# Patient Record
Sex: Female | Born: 1975 | Race: Black or African American | Hispanic: No | Marital: Single | Smoking: Former smoker
Health system: Southern US, Community
[De-identification: ages and names within clinical notes are randomized; demographics above are authoritative.]

## PROBLEM LIST (undated history)

## (undated) DIAGNOSIS — C539 Malignant neoplasm of cervix uteri, unspecified: Secondary | ICD-10-CM

## (undated) DIAGNOSIS — I10 Essential (primary) hypertension: Secondary | ICD-10-CM

## (undated) DIAGNOSIS — D649 Anemia, unspecified: Secondary | ICD-10-CM

## (undated) DIAGNOSIS — E785 Hyperlipidemia, unspecified: Secondary | ICD-10-CM

## (undated) DIAGNOSIS — B20 Human immunodeficiency virus [HIV] disease: Secondary | ICD-10-CM

## (undated) HISTORY — PX: LEEP: SHX91

## (undated) HISTORY — PX: ABDOMINAL HYSTERECTOMY: SHX81

## (undated) HISTORY — DX: Essential (primary) hypertension: I10

## (undated) HISTORY — DX: Anemia, unspecified: D64.9

## (undated) HISTORY — DX: Malignant neoplasm of cervix uteri, unspecified: C53.9

## (undated) HISTORY — DX: Hyperlipidemia, unspecified: E78.5

## (undated) HISTORY — DX: Human immunodeficiency virus (HIV) disease: B20

---

## 2012-01-20 DIAGNOSIS — F331 Major depressive disorder, recurrent, moderate: Secondary | ICD-10-CM | POA: Diagnosis not present

## 2012-01-23 DIAGNOSIS — L723 Sebaceous cyst: Secondary | ICD-10-CM | POA: Diagnosis not present

## 2012-03-17 DIAGNOSIS — F331 Major depressive disorder, recurrent, moderate: Secondary | ICD-10-CM | POA: Diagnosis not present

## 2012-04-13 DIAGNOSIS — E785 Hyperlipidemia, unspecified: Secondary | ICD-10-CM | POA: Diagnosis not present

## 2012-04-13 DIAGNOSIS — Z113 Encounter for screening for infections with a predominantly sexual mode of transmission: Secondary | ICD-10-CM | POA: Diagnosis not present

## 2012-04-13 DIAGNOSIS — B20 Human immunodeficiency virus [HIV] disease: Secondary | ICD-10-CM | POA: Diagnosis not present

## 2012-04-23 DIAGNOSIS — E785 Hyperlipidemia, unspecified: Secondary | ICD-10-CM | POA: Diagnosis not present

## 2012-04-23 DIAGNOSIS — B20 Human immunodeficiency virus [HIV] disease: Secondary | ICD-10-CM | POA: Diagnosis not present

## 2012-07-13 DIAGNOSIS — D649 Anemia, unspecified: Secondary | ICD-10-CM | POA: Diagnosis not present

## 2012-07-13 DIAGNOSIS — B20 Human immunodeficiency virus [HIV] disease: Secondary | ICD-10-CM | POA: Diagnosis not present

## 2012-10-13 DIAGNOSIS — B20 Human immunodeficiency virus [HIV] disease: Secondary | ICD-10-CM | POA: Diagnosis not present

## 2012-10-13 DIAGNOSIS — R109 Unspecified abdominal pain: Secondary | ICD-10-CM | POA: Diagnosis not present

## 2012-11-30 DIAGNOSIS — Z79899 Other long term (current) drug therapy: Secondary | ICD-10-CM | POA: Diagnosis not present

## 2012-11-30 DIAGNOSIS — R1032 Left lower quadrant pain: Secondary | ICD-10-CM | POA: Diagnosis not present

## 2012-11-30 DIAGNOSIS — R109 Unspecified abdominal pain: Secondary | ICD-10-CM | POA: Diagnosis not present

## 2012-11-30 DIAGNOSIS — B20 Human immunodeficiency virus [HIV] disease: Secondary | ICD-10-CM | POA: Diagnosis not present

## 2012-11-30 DIAGNOSIS — D649 Anemia, unspecified: Secondary | ICD-10-CM | POA: Diagnosis not present

## 2012-11-30 DIAGNOSIS — Z8541 Personal history of malignant neoplasm of cervix uteri: Secondary | ICD-10-CM | POA: Diagnosis not present

## 2012-11-30 DIAGNOSIS — I10 Essential (primary) hypertension: Secondary | ICD-10-CM | POA: Diagnosis not present

## 2012-11-30 DIAGNOSIS — N949 Unspecified condition associated with female genital organs and menstrual cycle: Secondary | ICD-10-CM | POA: Diagnosis not present

## 2012-12-01 DIAGNOSIS — K5900 Constipation, unspecified: Secondary | ICD-10-CM | POA: Diagnosis not present

## 2012-12-01 DIAGNOSIS — R109 Unspecified abdominal pain: Secondary | ICD-10-CM | POA: Diagnosis not present

## 2012-12-06 DIAGNOSIS — K5900 Constipation, unspecified: Secondary | ICD-10-CM | POA: Diagnosis not present

## 2012-12-06 DIAGNOSIS — R109 Unspecified abdominal pain: Secondary | ICD-10-CM | POA: Diagnosis not present

## 2012-12-06 DIAGNOSIS — N83209 Unspecified ovarian cyst, unspecified side: Secondary | ICD-10-CM | POA: Diagnosis not present

## 2012-12-06 DIAGNOSIS — K5730 Diverticulosis of large intestine without perforation or abscess without bleeding: Secondary | ICD-10-CM | POA: Diagnosis not present

## 2013-01-04 DIAGNOSIS — E559 Vitamin D deficiency, unspecified: Secondary | ICD-10-CM | POA: Diagnosis not present

## 2013-01-04 DIAGNOSIS — R5381 Other malaise: Secondary | ICD-10-CM | POA: Diagnosis not present

## 2013-01-04 DIAGNOSIS — K5900 Constipation, unspecified: Secondary | ICD-10-CM | POA: Diagnosis not present

## 2013-01-04 DIAGNOSIS — B20 Human immunodeficiency virus [HIV] disease: Secondary | ICD-10-CM | POA: Diagnosis not present

## 2013-01-04 DIAGNOSIS — E785 Hyperlipidemia, unspecified: Secondary | ICD-10-CM | POA: Diagnosis not present

## 2013-01-27 DIAGNOSIS — B20 Human immunodeficiency virus [HIV] disease: Secondary | ICD-10-CM | POA: Diagnosis not present

## 2013-01-31 DIAGNOSIS — H35419 Lattice degeneration of retina, unspecified eye: Secondary | ICD-10-CM | POA: Diagnosis not present

## 2013-01-31 DIAGNOSIS — H33329 Round hole, unspecified eye: Secondary | ICD-10-CM | POA: Diagnosis not present

## 2013-03-11 DIAGNOSIS — R32 Unspecified urinary incontinence: Secondary | ICD-10-CM | POA: Diagnosis not present

## 2013-03-11 DIAGNOSIS — K5900 Constipation, unspecified: Secondary | ICD-10-CM | POA: Diagnosis not present

## 2013-03-11 DIAGNOSIS — J329 Chronic sinusitis, unspecified: Secondary | ICD-10-CM | POA: Diagnosis not present

## 2013-04-29 DIAGNOSIS — R030 Elevated blood-pressure reading, without diagnosis of hypertension: Secondary | ICD-10-CM | POA: Diagnosis not present

## 2013-04-29 DIAGNOSIS — R51 Headache: Secondary | ICD-10-CM | POA: Diagnosis not present

## 2013-04-29 DIAGNOSIS — B20 Human immunodeficiency virus [HIV] disease: Secondary | ICD-10-CM | POA: Diagnosis not present

## 2013-08-04 DIAGNOSIS — E559 Vitamin D deficiency, unspecified: Secondary | ICD-10-CM | POA: Diagnosis not present

## 2013-08-04 DIAGNOSIS — I10 Essential (primary) hypertension: Secondary | ICD-10-CM | POA: Diagnosis not present

## 2013-08-10 DIAGNOSIS — Z1231 Encounter for screening mammogram for malignant neoplasm of breast: Secondary | ICD-10-CM | POA: Diagnosis not present

## 2013-08-18 DIAGNOSIS — E559 Vitamin D deficiency, unspecified: Secondary | ICD-10-CM | POA: Diagnosis not present

## 2013-08-18 DIAGNOSIS — I10 Essential (primary) hypertension: Secondary | ICD-10-CM | POA: Diagnosis not present

## 2013-09-07 DIAGNOSIS — B20 Human immunodeficiency virus [HIV] disease: Secondary | ICD-10-CM | POA: Diagnosis not present

## 2014-03-02 DIAGNOSIS — B20 Human immunodeficiency virus [HIV] disease: Secondary | ICD-10-CM | POA: Diagnosis not present

## 2014-03-02 DIAGNOSIS — E559 Vitamin D deficiency, unspecified: Secondary | ICD-10-CM | POA: Diagnosis not present

## 2014-04-06 DIAGNOSIS — H33323 Round hole, bilateral: Secondary | ICD-10-CM | POA: Diagnosis not present

## 2014-04-17 DIAGNOSIS — Z79899 Other long term (current) drug therapy: Secondary | ICD-10-CM | POA: Diagnosis not present

## 2014-04-17 DIAGNOSIS — Z13220 Encounter for screening for lipoid disorders: Secondary | ICD-10-CM | POA: Diagnosis not present

## 2014-04-17 DIAGNOSIS — B20 Human immunodeficiency virus [HIV] disease: Secondary | ICD-10-CM | POA: Diagnosis not present

## 2014-04-17 DIAGNOSIS — Z90710 Acquired absence of both cervix and uterus: Secondary | ICD-10-CM | POA: Diagnosis not present

## 2014-04-17 DIAGNOSIS — Z803 Family history of malignant neoplasm of breast: Secondary | ICD-10-CM | POA: Diagnosis not present

## 2014-04-17 DIAGNOSIS — E559 Vitamin D deficiency, unspecified: Secondary | ICD-10-CM | POA: Diagnosis not present

## 2014-04-17 DIAGNOSIS — C539 Malignant neoplasm of cervix uteri, unspecified: Secondary | ICD-10-CM | POA: Diagnosis not present

## 2014-04-17 DIAGNOSIS — Z1231 Encounter for screening mammogram for malignant neoplasm of breast: Secondary | ICD-10-CM | POA: Diagnosis not present

## 2014-04-17 DIAGNOSIS — I10 Essential (primary) hypertension: Secondary | ICD-10-CM | POA: Diagnosis not present

## 2014-04-17 DIAGNOSIS — Z0000 Encounter for general adult medical examination without abnormal findings: Secondary | ICD-10-CM | POA: Diagnosis not present

## 2014-04-27 DIAGNOSIS — Z1231 Encounter for screening mammogram for malignant neoplasm of breast: Secondary | ICD-10-CM | POA: Diagnosis not present

## 2014-05-25 ENCOUNTER — Telehealth: Payer: Self-pay | Admitting: Genetic Counselor

## 2014-05-25 NOTE — Telephone Encounter (Signed)
S/W PATIENT AND GAVE GENETIC APPT FOR 02/04 @ 9 W/GENETIC COUNSELOR  REFERRING DR. Rica Records DX-FHX OF BREAST CA

## 2014-06-01 ENCOUNTER — Ambulatory Visit (HOSPITAL_BASED_OUTPATIENT_CLINIC_OR_DEPARTMENT_OTHER): Payer: Medicare Other | Admitting: Genetic Counselor

## 2014-06-01 ENCOUNTER — Other Ambulatory Visit: Payer: Medicare Other

## 2014-06-01 DIAGNOSIS — Z8541 Personal history of malignant neoplasm of cervix uteri: Secondary | ICD-10-CM | POA: Diagnosis not present

## 2014-06-01 DIAGNOSIS — Z315 Encounter for genetic counseling: Secondary | ICD-10-CM

## 2014-06-01 DIAGNOSIS — Z803 Family history of malignant neoplasm of breast: Secondary | ICD-10-CM

## 2014-06-01 NOTE — Progress Notes (Signed)
[facility] Patient Visit  REFERRING PROVIDER: Nicholos Johns, MD [address] [place], [place] [address]  PRIMARY PROVIDER:  No primary care provider on file.  PRIMARY REASON FOR VISIT:  1. Family history of breast cancer     HISTORY OF PRESENT ILLNESS:   Jacqueline Lowe, a 39 y.o. female, was seen for a [facility] cancer genetics consultation at the request of Dr. Rica Records due to a family history of breast cancer.  Jacqueline Lowe presents to clinic today to discuss the possibility of a hereditary predisposition to cancer, genetic testing, and to further clarify her future cancer risks, as well as potential cancer risks for family members.   CANCER HISTORY:  Jacqueline Lowe was diagnosed with cervical cancer at age 84. She has no history of other cancer  HORMONAL RISK FACTORS AND CANCER SCREENING:  Menarche was at age 44.  First live birth at age 64.  Ovaries intact: yes.  Hysterectomy: yes.  Menopausal status: premenopausal.  Colonoscopy: n/a; not examined. Mammogram within the last year: yes. Number of breast biopsies: 0. Up to date with pelvic exams:  yes. Any excessive radiation exposure in the past:  n/a  Past Medical History  Diagnosis Date   Cervical cancer     dx at age 60   HIV (human immunodeficiency virus infection)     reported to be HIV+ by patient    Past Surgical History  Procedure Laterality Date   Abdominal hysterectomy      excessive bleeding; ovaries remain intact    History   Social History   Marital Status: Single    Spouse Name: N/A    Number of Children: N/A   Years of Education: N/A   Social History Main Topics   Smoking status: Not on file   Smokeless tobacco: Not on file   Alcohol Use: Not on file   Drug Use: Not on file   Sexual Activity: Not on file   Other Topics Concern   Not on file   Social History Narrative   No narrative on file     FAMILY HISTORY:  During the  visit, a 4-generation pedigree was obtained. A copy of the pedigree with be scanned into Epic under the Media tab. Significant family history diagnoses include the following: Family History  Problem Relation Age of Onset   Other Father     does not know father or pat history   Cancer Maternal Aunt 23    breast cancer   Cancer Maternal Aunt 40    breast cancer   Jacqueline Lowe's ancestry is of [place], [place] and Caucasian descent. There is no known Jewish ancestry or consanguinity.  GENETIC COUNSELING ASSESSMENT:  Jacqueline Lowe is a 39 y.o. female with a maternal family history of early onset breast cancer somewhat suggestive of a hereditary predisposition to cancer. We, therefore, discussed and recommended the following at today's visit.   DISCUSSION:  We reviewed the characteristics, features and inheritance patterns of hereditary cancer syndromes. We also discussed genetic testing, including the appropriate family members to test, the process of testing, insurance coverage and turn-around-time for results. We discussed the implications of a negative, positive and/or variant of uncertain significant result. We recommended Jacqueline Lowe pursue genetic testing for the BreastNext gene panel. The BreastNext gene panel offered by [facility] includes sequencing and rearrangement analysis for the following 17 genes: ATM, BARD1, BRCA1, BRCA2, BRIP1, CDH1, CHEK2, MRE11A, MUTYH, NBN, NF1, PALB2, PTEN, RAD50, RAD51C, RAD51D,  and TP53.  PLAN:  Based on our above recommendation, Jacqueline Lowe wished to pursue genetic testing and the blood sample was drawn and will be sent to [facility] for analysis. Results should be available within approximately 6 weeks time, at which point they will be disclosed by telephone to Jacqueline Lowe, as will any additional recommendations warranted by these results. Based on Jacqueline Lowe family history, we also recommended her maternal aunts diagnosed with  breast cancer have genetic counseling and testing. Jacqueline Lowe will let us know if we can be of any assistance in coordinating genetic counseling and/or testing for this family member. Lastly, we encouraged Jacqueline Lowe to remain in contact with cancer genetics annually so that we can continuously update the family history and inform her of any changes in cancer genetics and testing that may be of benefit for this family.   Ms.  Lowe questions were answered to her satisfaction today. Our contact information was provided should additional questions or concerns arise. Thank you for the referral and allowing [place] to share in the care of your patient.   Jacqueline A. Fine, MS, CGC Certified Psychologist, sport and [email or web address] phone: [phone]  The patient was seen for a total of 40 minutes in face-to-face genetic counseling.  This patient was discussed with Dr. Jana Hakim who agrees with the above.    ______________________________________________________________________ For Office Staff:  Number of people involved in session including genetic counselor: 2 Was an intern or student involved with case: not applicable

## 2014-06-26 ENCOUNTER — Encounter: Payer: Self-pay | Admitting: Genetic Counselor

## 2014-06-26 DIAGNOSIS — Z803 Family history of malignant neoplasm of breast: Secondary | ICD-10-CM

## 2014-06-26 NOTE — Progress Notes (Signed)
[facility]  Genetic Test Results PRIMARY PROVIDER:  No primary care provider on file.  PRIMARY REASON FOR VISIT:  Family history of breast cancer  GENETIC TEST RESULT:  Testing Laboratory: [facility]  Test Ordered: BreastNext gene panel Date of Report: 06/21/2014 Result: Normal, no pathogenic mutations identified General Interpretation: Reassuring  HPI: Jacqueline Lowe was previously seen in [facility] due to concerns regarding a hereditary predisposition to cancer. Please refer to our prior cancer genetics clinic note for more information regarding Jacqueline Lowe's medical, social and family histories, and our assessment and recommendations, at the time. Jacqueline Lowe genetic test results and recommendations warranted by these results were recently disclosed to her and are discussed in more detail below.  GENETIC TEST RESULTS: At the time of Jacqueline Lowe's visit, we recommended she pursue genetic testing, which includes sequencing and deletion/duplication analysis of several genes associated with an increased risk for cancer via a gene panel. The BreastNext gene panel offered by [facility] and includes sequencing and rearrangement analysis for the following 17 genes: ATM, BARD1, BRCA1, BRCA2, BRIP1, CDH1, CHEK2, MRE11A, MUTYH, NBN, NF1, PALB2, PTEN, RAD50, RAD51C, RAD51D, and TP53. Genetic testing for this gene panel was normal and did not reveal a pathogenic mutation in any of these genes. A copy of the genetic test report will be scanned into Epic under the media tab.   We discussed with Jacqueline Lowe that current genetic testing is not perfect, and it is, therefore, possible there may be a pathogenic gene mutation in one of these genes that current testing cannot detect, but that chance is small.  We also discussed, that it is possible that another gene that has not yet been discovered, or that we have not yet tested, is  responsible for the cancer diagnoses in her family. It is, therefore, important for Jacqueline Lowe to continue to remain in touch with cancer genetics so that we can continue to offer Jacqueline Lowe the most up to date genetic testing.   CANCER SCREENING RECOMMENDATIONS:  This normal result is reassuring and indicates that Jacqueline Lowe does not likely have an increased risk of cancer due to a mutation in one of these genes.  We, therefore, recommended  Jacqueline Lowe continue to follow the cancer screening guidelines provided by her primary healthcare providers.   RECOMMENDATIONS FOR FAMILY MEMBERS:  While these results are reassuring for Jacqueline Lowe, this test does not tell us anything about Jacqueline Lowe's sister's and maternal relatives' risks. We recommended these relatives also have genetic counseling and testing and are happy to help facilitate testing and/or a referral if needed. Cancer genetic counselors can also be located, by visiting the website of the [employer] (Artist[employer].se) and Field seismologist for a Oncologist by zip code.    FOLLOW-UP: Lastly, we discussed with Jacqueline Lowe that cancer genetics is a rapidly advancing field and it is likely that new genetic tests will be appropriate for her and/or family members in the future. We encouraged her to remain in contact with cancer genetics on an annual basis so we can update her personal and family histories and let her know of advances in cancer genetics that may benefit this family.   Our contact number was provided. Jacqueline Lowe questions were answered to her satisfaction, and she knows she is welcome to call us at anytime with additional questions or concerns.    Jacqueline A. Fine, MS, CGC Certified  Psychologist, [email or web address] Phone: [phone]

## 2014-07-13 DIAGNOSIS — H33302 Unspecified retinal break, left eye: Secondary | ICD-10-CM | POA: Diagnosis not present

## 2014-07-13 DIAGNOSIS — H35413 Lattice degeneration of retina, bilateral: Secondary | ICD-10-CM | POA: Diagnosis not present

## 2014-07-26 DIAGNOSIS — H33302 Unspecified retinal break, left eye: Secondary | ICD-10-CM | POA: Diagnosis not present

## 2014-08-02 DIAGNOSIS — H33302 Unspecified retinal break, left eye: Secondary | ICD-10-CM | POA: Diagnosis not present

## 2014-08-17 DIAGNOSIS — I10 Essential (primary) hypertension: Secondary | ICD-10-CM | POA: Diagnosis not present

## 2014-08-17 DIAGNOSIS — B20 Human immunodeficiency virus [HIV] disease: Secondary | ICD-10-CM | POA: Diagnosis not present

## 2014-09-04 ENCOUNTER — Telehealth: Payer: Self-pay

## 2014-09-04 NOTE — Telephone Encounter (Signed)
Patient calling to transfer care from [place] [facility] [place].  Medical records received.   She lives in [place] and has requested same day labs and visit.  She was recently changed to [place] and wanted to follow up in one month.  Directions given.   Laverle Patter, RN

## 2014-09-27 DIAGNOSIS — H33303 Unspecified retinal break, bilateral: Secondary | ICD-10-CM | POA: Diagnosis not present

## 2014-10-02 ENCOUNTER — Encounter: Payer: Self-pay | Admitting: *Deleted

## 2014-10-02 ENCOUNTER — Other Ambulatory Visit (HOSPITAL_COMMUNITY)
Admission: RE | Admit: 2014-10-02 | Discharge: 2014-10-02 | Disposition: A | Discharge status: Home / Self Care | Payer: Medicare Other | Source: Ambulatory Visit | Attending: Internal Medicine | Admitting: Internal Medicine

## 2014-10-02 ENCOUNTER — Encounter: Payer: Self-pay | Admitting: Internal Medicine

## 2014-10-02 ENCOUNTER — Ambulatory Visit (INDEPENDENT_AMBULATORY_CARE_PROVIDER_SITE_OTHER): Payer: Medicare Other | Admitting: Internal Medicine

## 2014-10-02 VITALS — BP 172/124 | HR 78 | Temp 98.5°F | Ht 64.0 in | Wt 169.0 lb

## 2014-10-02 DIAGNOSIS — Z113 Encounter for screening for infections with a predominantly sexual mode of transmission: Secondary | ICD-10-CM | POA: Insufficient documentation

## 2014-10-02 DIAGNOSIS — Z79899 Other long term (current) drug therapy: Secondary | ICD-10-CM | POA: Diagnosis not present

## 2014-10-02 DIAGNOSIS — B20 Human immunodeficiency virus [HIV] disease: Secondary | ICD-10-CM | POA: Insufficient documentation

## 2014-10-02 NOTE — Progress Notes (Signed)
   Subjective:    Patient ID: Jacqueline Lowe, female    DOB: 28-Dec-1975, 39 y.o.   MRN: [id number]  HPI Jacqueline Lowe is a 39 y/o female transferring care for HIV from [place]. Dx with HIV in 2000 and taking Stribild X1 month, tolerating well. No missed doses. Last CD4/VL checked before Stribild was started. She states she has been undetectable for years with last CD4 count 748. She has a PMH of invasive cervical CA with partial hysterectomy in 2013. Fam Hx of breast CA. Genetic testing done for breast CA and was normal. Sexual history: monogomous with partner X 9 years, uses condoms. No Hx STD's. Her partner is HIV neg. She does not smoke. No recreational drug use. Has refused vaccines in the past. Occasionally drinks alcohol. Her blood pressure today is elevated. She denies headache or blurry vision. She states she used to take HCTZ but this was stopped a few years ago because her blood pressure was normal. She states she no longer has a PCP.   Outpatient Encounter Prescriptions as of 10/02/2014  Medication Sig Note  . Cholecalciferol (VITAMIN D PO) Take 5,000 Units by mouth daily.   [name] 150-150-200-300 MG TABS tablet  10/02/2014: Received from: External Pharmacy   No facility-administered encounter medications on file as of 10/02/2014.     Review of Systems  Constitutional: Negative for fever, chills, diaphoresis, fatigue and unexpected weight change.  HENT: Negative for mouth sores, sore throat and tinnitus.   Eyes: Negative for visual disturbance.  Respiratory: Negative for shortness of breath.   Cardiovascular: Negative for leg swelling.  Gastrointestinal: Negative for nausea, abdominal pain, diarrhea and constipation.  Genitourinary: Negative for difficulty urinating and menstrual problem.  Musculoskeletal: Negative for myalgias, joint swelling and arthralgias.  Skin: Negative for rash.  Neurological: Negative for light-headedness and headaches.  Hematological: Negative for  adenopathy.  Psychiatric/Behavioral: Negative for behavioral problems.       Objective:   Physical Exam  Constitutional: She is oriented to person, place, and time. She appears well-developed and well-nourished.  HENT:  Head: Normocephalic and atraumatic.  Mouth/Throat: No oropharyngeal exudate.  Eyes: Conjunctivae are normal. Pupils are equal, round, and reactive to light.  Neck: Normal range of motion. Neck supple.  Cardiovascular: Normal rate and regular rhythm.   Pulmonary/Chest: Effort normal and breath sounds normal.  Abdominal: Soft. Bowel sounds are normal. She exhibits no distension.  Musculoskeletal: Normal range of motion.  Lymphadenopathy:    She has no cervical adenopathy.  Neurological: She is alert and oriented to person, place, and time.  Skin: Skin is warm and dry.  Multiple tattoos and piercings  Psychiatric: She has a normal mood and affect. Her behavior is normal.          Assessment & Plan:  HIV, well controlled: Continue current therapy of Stribild Will check labs today RTC in 4 months Refuses all vaccines  Hypertension: Given information about obtaining PCP to follow up on BP

## 2014-10-02 NOTE — Progress Notes (Signed)
Patient ID: Jacqueline Lowe, female   DOB: [birth year], 39 y.o.   MRN: [id number]  Patient ID: Jacqueline Lowe, female    DOB: [birth year], 39 y.o.   MRN: [id number]  HPI:   She comes in to establish as a new patient.  She has a history of HIV for 16 years and has previously been on Viramune and combivir, then Reyataz/r and Truvada and recently started on Stribild, which she has been on 1 month.  Some nausea but tolerated much better than previous regimen.  No history of OIs, other STIs.  History of hysterectomy.  Previously at a clinic in [place] but now living in [place].  Was also previously on blood pressure medication.  Does not smoke.  No history of drug use.   Past Medical History  Diagnosis Date  . Cervical cancer     dx at age 66  . HIV (human immunodeficiency virus infection)     reported to be HIV+ by patient    Prior to Admission medications   Medication Sig Start Date End Date Taking? Authorizing Provider  Cholecalciferol (VITAMIN D PO) Take 5,000 Units by mouth daily.   Yes Historical Provider, MD  STRIBILD 150-150-200-300 MG TABS tablet  09/19/14  Yes Historical Provider, MD    No Known Allergies  History  Substance Use Topics  . Smoking status: Former Research scientist (life sciences)  . Smokeless tobacco: Never Used  . Alcohol Use: 0.0 oz/week    0 Standard drinks or equivalent per week     Comment: socially    Family History  Problem Relation Age of Onset  . Other Father     does not know father or pat history  . Cancer Maternal Aunt 40    breast cancer  . Cancer Maternal Aunt 40    breast cancer    Review of Systems A comprehensive review of systems was negative.   Filed Vitals:   10/02/14 1355  BP: 172/124  Pulse: 78  Temp: 98.5 F (36.9 C)   in no apparent distress and alert HEENT: anicteric Cor RRR and No murmurs clear Bowel sounds are normal, liver is not enlarged, spleen is not enlarged peripheral pulses normal, no pedal edema, no clubbing or cyanosis negative for  - jaundice, spider hemangioma, telangiectasia, palmar erythema, ecchymosis and atrophy  No results found for: HIV1RNAQUANT No components found for: HIV1GENOTYPRPLUS No components found for: THELPERCELL  Assessment: new HIV patient  Plan: 1) check viral load and CD4 count and all labs.   2) patient refuses all vaccines. 3) BP high and will get a PCP.

## 2014-10-03 ENCOUNTER — Telehealth: Payer: Self-pay | Admitting: *Deleted

## 2014-10-03 LAB — RPR

## 2014-10-03 LAB — CBC WITH DIFFERENTIAL/PLATELET
BASOS PCT: 0 % (ref 0–1)
Basophils Absolute: 0 10*3/uL (ref 0.0–0.1)
EOS PCT: 2 % (ref 0–5)
Eosinophils Absolute: 0.1 10*3/uL (ref 0.0–0.7)
HCT: 37.9 % (ref 36.0–46.0)
Hemoglobin: 12.6 g/dL (ref 12.0–15.0)
LYMPHS PCT: 37 % (ref 12–46)
Lymphs Abs: 1.7 10*3/uL (ref 0.7–4.0)
MCH: 28.6 pg (ref 26.0–34.0)
MCHC: 33.2 g/dL (ref 30.0–36.0)
MCV: 85.9 fL (ref 78.0–100.0)
MPV: 10 fL (ref 8.6–12.4)
Monocytes Absolute: 0.2 10*3/uL (ref 0.1–1.0)
Monocytes Relative: 5 % (ref 3–12)
NEUTROS PCT: 56 % (ref 43–77)
Neutro Abs: 2.5 10*3/uL (ref 1.7–7.7)
PLATELETS: 322 10*3/uL (ref 150–400)
RBC: 4.41 MIL/uL (ref 3.87–5.11)
RDW: 14.1 % (ref 11.5–15.5)
WBC: 4.5 10*3/uL (ref 4.0–10.5)

## 2014-10-03 LAB — LIPID PANEL
CHOL/HDL RATIO: 8.1 ratio
Cholesterol: 218 mg/dL — ABNORMAL HIGH (ref 0–200)
HDL: 27 mg/dL — ABNORMAL LOW (ref >=46)
LDL Cholesterol: 146 mg/dL — ABNORMAL HIGH (ref 0–99)
Triglycerides: 226 mg/dL — ABNORMAL HIGH (ref <150)
VLDL: 45 mg/dL — ABNORMAL HIGH (ref 0–40)

## 2014-10-03 LAB — COMPLETE METABOLIC PANEL WITH GFR
ALBUMIN: 4 g/dL (ref 3.5–5.2)
ALT: 9 U/L (ref 0–35)
AST: 13 U/L (ref 0–37)
Alkaline Phosphatase: 60 U/L (ref 39–117)
BILIRUBIN TOTAL: 0.3 mg/dL (ref 0.2–1.2)
BUN: 6 mg/dL (ref 6–23)
CO2: 24 mEq/L (ref 19–32)
Calcium: 9.8 mg/dL (ref 8.4–10.5)
Chloride: 105 mEq/L (ref 96–112)
Creat: 0.71 mg/dL (ref 0.50–1.10)
GFR, Est African American: >89 mL/min
GLUCOSE: 127 mg/dL — AB (ref 70–99)
POTASSIUM: 3.6 meq/L (ref 3.5–5.3)
Sodium: 137 mEq/L (ref 135–145)
TOTAL PROTEIN: 8.1 g/dL (ref 6.0–8.3)

## 2014-10-03 LAB — HIV 1/2 CONFIRMATION
HIV 2 AB: NEGATIVE
HIV-1 antibody: POSITIVE — AB

## 2014-10-03 LAB — HIV ANTIBODY (ROUTINE TESTING W REFLEX): HIV: REACTIVE — AB

## 2014-10-03 LAB — HEPATITIS B SURFACE ANTIBODY,QUALITATIVE: Hep B S Ab: NEGATIVE

## 2014-10-03 LAB — HEPATITIS B SURFACE ANTIGEN: Hepatitis B Surface Ag: NEGATIVE

## 2014-10-03 LAB — HEPATITIS A ANTIBODY, TOTAL: Hep A Total Ab: NONREACTIVE

## 2014-10-03 LAB — HEPATITIS B CORE ANTIBODY, TOTAL: Hep B Core Total Ab: NONREACTIVE

## 2014-10-03 NOTE — Telephone Encounter (Signed)
Patient has been referred to Dr Buelah Manis at [facility] for primary care. She was given the phone number to set up an appointment.  Per the scheduler, they are accepting patients with medicare/medicaid, will be able to see her within a few weeks once she contacts them. Landis Gandy, RN

## 2014-10-04 LAB — T-HELPER CELL (CD4) - (RCID CLINIC ONLY)
CD4 % Helper T Cell: 39 % (ref 33–55)
CD4 T CELL ABS: 640 /uL (ref 400–2700)

## 2014-10-04 LAB — URINE CYTOLOGY ANCILLARY ONLY
Chlamydia: NEGATIVE
Neisseria Gonorrhea: NEGATIVE

## 2014-10-04 LAB — HIV-1 RNA QUANT-NO REFLEX-BLD

## 2014-10-07 LAB — HLA B*5701: HLA-B 5701 W/RFLX HLA-B HIGH: NEGATIVE

## 2015-02-01 ENCOUNTER — Encounter: Payer: Self-pay | Admitting: Internal Medicine

## 2015-02-01 ENCOUNTER — Ambulatory Visit (INDEPENDENT_AMBULATORY_CARE_PROVIDER_SITE_OTHER): Payer: Medicare Other | Admitting: Internal Medicine

## 2015-02-01 VITALS — BP 156/111 | HR 105 | Temp 98.5°F | Wt 176.0 lb

## 2015-02-01 DIAGNOSIS — B20 Human immunodeficiency virus [HIV] disease: Secondary | ICD-10-CM | POA: Diagnosis not present

## 2015-02-01 NOTE — Assessment & Plan Note (Signed)
Doing well.  Labs today and rtc in 4-5 months unless concerns.  She was again given the phone number for PCP to establish care.

## 2015-02-01 NOTE — Progress Notes (Signed)
   Subjective:    Patient ID: Jacqueline Lowe, female    DOB: [birth year], 39 y.o.   MRN: [id number]  HPI Here for follow up of HIV.  On stribild and denies any missed doses.  Some fatigue, headache.  Did not establish with a PCP though was given the information for [facility].  No new complaints.     Review of Systems  Constitutional: Negative for fatigue.  Gastrointestinal: Negative for nausea and diarrhea.  Skin: Negative for rash.  Neurological: Negative for dizziness and light-headedness.       Objective:   Physical Exam  Constitutional: She appears well-developed and well-nourished.  Eyes: No scleral icterus.  Cardiovascular: Normal rate, regular rhythm and normal heart sounds.   No murmur heard. Pulmonary/Chest: Effort normal and breath sounds normal. No respiratory distress.  Skin: No rash noted.          Assessment & Plan:

## 2015-02-02 LAB — T-HELPER CELL (CD4) - (RCID CLINIC ONLY)
CD4 % Helper T Cell: 36 % (ref 33–55)
CD4 T CELL ABS: 630 /uL (ref 400–2700)

## 2015-02-04 LAB — HIV-1 RNA QUANT-NO REFLEX-BLD

## 2015-02-28 ENCOUNTER — Other Ambulatory Visit: Payer: Self-pay | Admitting: *Deleted

## 2015-02-28 ENCOUNTER — Telehealth: Payer: Self-pay | Admitting: *Deleted

## 2015-02-28 MED ORDER — STRIBILD 150-150-200-300 MG PO TABS: 1.0000 | ORAL_TABLET | Freq: Every day | ORAL | Status: DC

## 2015-02-28 NOTE — Telephone Encounter (Signed)
Patient called requesting a refill on Stribild and Vitamin D. Refilled the Stribild, however Dr. Linus Salmons never prescribed the vitamin D. Asked if she ever scheduled an appt with Jonni Sanger for PCP and she said she could never get anyone to answer the phone. Scheduled her an appt at [facility] for 03/15/15 and provided her the address and phone number.

## 2015-03-15 ENCOUNTER — Ambulatory Visit (INDEPENDENT_AMBULATORY_CARE_PROVIDER_SITE_OTHER): Payer: Medicare Other | Admitting: Family Medicine

## 2015-03-15 ENCOUNTER — Encounter: Payer: Self-pay | Admitting: Family Medicine

## 2015-03-15 VITALS — BP 160/96 | HR 100 | Temp 98.5°F | Resp 16 | Ht 64.0 in | Wt 175.0 lb

## 2015-03-15 DIAGNOSIS — R739 Hyperglycemia, unspecified: Secondary | ICD-10-CM

## 2015-03-15 DIAGNOSIS — E785 Hyperlipidemia, unspecified: Secondary | ICD-10-CM | POA: Insufficient documentation

## 2015-03-15 DIAGNOSIS — Z862 Personal history of diseases of the blood and blood-forming organs and certain disorders involving the immune mechanism: Secondary | ICD-10-CM

## 2015-03-15 DIAGNOSIS — I10 Essential (primary) hypertension: Secondary | ICD-10-CM

## 2015-03-15 DIAGNOSIS — R7309 Other abnormal glucose: Secondary | ICD-10-CM | POA: Diagnosis not present

## 2015-03-15 DIAGNOSIS — Z0000 Encounter for general adult medical examination without abnormal findings: Secondary | ICD-10-CM

## 2015-03-15 DIAGNOSIS — B20 Human immunodeficiency virus [HIV] disease: Secondary | ICD-10-CM

## 2015-03-15 LAB — CBC WITH DIFFERENTIAL/PLATELET
BASOS ABS: 0 10*3/uL (ref 0.0–0.1)
Basophils Relative: 1 % (ref 0–1)
Eosinophils Absolute: 0.1 10*3/uL (ref 0.0–0.7)
Eosinophils Relative: 2 % (ref 0–5)
HEMATOCRIT: 41.2 % (ref 36.0–46.0)
HEMOGLOBIN: 13.6 g/dL (ref 12.0–15.0)
LYMPHS ABS: 1.9 10*3/uL (ref 0.7–4.0)
LYMPHS PCT: 39 % (ref 12–46)
MCH: 29.3 pg (ref 26.0–34.0)
MCHC: 33 g/dL (ref 30.0–36.0)
MCV: 88.8 fL (ref 78.0–100.0)
MONO ABS: 0.3 10*3/uL (ref 0.1–1.0)
MPV: 10.3 fL (ref 8.6–12.4)
Monocytes Relative: 7 % (ref 3–12)
NEUTROS ABS: 2.4 10*3/uL (ref 1.7–7.7)
Neutrophils Relative %: 51 % (ref 43–77)
Platelets: 364 10*3/uL (ref 150–400)
RBC: 4.64 MIL/uL (ref 3.87–5.11)
RDW: 13.2 % (ref 11.5–15.5)
WBC: 4.8 10*3/uL (ref 4.0–10.5)

## 2015-03-15 LAB — COMPLETE METABOLIC PANEL WITH GFR
ALT: 11 U/L (ref 6–29)
AST: 15 U/L (ref 10–30)
Albumin: 4.3 g/dL (ref 3.6–5.1)
Alkaline Phosphatase: 69 U/L (ref 33–115)
BUN: 10 mg/dL (ref 7–25)
CALCIUM: 9.8 mg/dL (ref 8.6–10.2)
CHLORIDE: 104 mmol/L (ref 98–110)
CO2: 24 mmol/L (ref 20–31)
CREATININE: 0.66 mg/dL (ref 0.50–1.10)
GFR, Est African American: >89 mL/min (ref >=60)
GFR, Est Non African American: >89 mL/min (ref >=60)
Glucose, Bld: 113 mg/dL — ABNORMAL HIGH (ref 65–99)
POTASSIUM: 3.5 mmol/L (ref 3.5–5.3)
SODIUM: 139 mmol/L (ref 135–146)
Total Bilirubin: 0.3 mg/dL (ref 0.2–1.2)
Total Protein: 8.1 g/dL (ref 6.1–8.1)

## 2015-03-15 LAB — POCT URINALYSIS DIP (DEVICE)
Bilirubin Urine: NEGATIVE
GLUCOSE, UA: NEGATIVE mg/dL
KETONES UR: NEGATIVE mg/dL
LEUKOCYTES UA: NEGATIVE
Nitrite: NEGATIVE
Protein, ur: 100 mg/dL — AB
SPECIFIC GRAVITY, URINE: 1.025 (ref 1.005–1.030)
UROBILINOGEN UA: 0.2 mg/dL (ref 0.0–1.0)
pH: 6.5 (ref 5.0–8.0)

## 2015-03-15 LAB — GLUCOSE, CAPILLARY: GLUCOSE-CAPILLARY: 114 mg/dL — AB (ref 65–99)

## 2015-03-15 LAB — LIPID PANEL
CHOL/HDL RATIO: 7.9 ratio — AB (ref <=5.0)
CHOLESTEROL: 229 mg/dL — AB (ref 125–200)
HDL: 29 mg/dL — ABNORMAL LOW (ref >=46)
LDL CALC: 157 mg/dL — AB (ref <130)
Triglycerides: 216 mg/dL — ABNORMAL HIGH (ref <150)
VLDL: 43 mg/dL — AB (ref <30)

## 2015-03-15 MED ORDER — AMLODIPINE BESYLATE 5 MG PO TABS: 5.0000 mg | ORAL_TABLET | Freq: Every day | ORAL | Status: DC

## 2015-03-15 MED ORDER — CLONIDINE HCL 0.1 MG PO TABS
0.1000 mg | ORAL_TABLET | Freq: Once | ORAL | Status: AC
  Administered 2015-03-15: 0.1 mg via ORAL

## 2015-03-15 MED ORDER — FENOFIBRATE 40 MG PO TABS: 40.0000 mg | ORAL_TABLET | Freq: Every day | ORAL | Status: DC

## 2015-03-15 MED ORDER — ASPIRIN EC 81 MG PO TBEC
81.0000 mg | DELAYED_RELEASE_TABLET | Freq: Every day | ORAL | Status: DC
Start: 2015-03-15 — End: 2015-10-25

## 2015-03-15 NOTE — Patient Instructions (Addendum)
Will start Amlodipine 5 mg today for hypertension. Will follow up for hypertension in 2 weeks.  Will start fenofibrate 40 mg for hyperlipidemia today. Will call patient with laboratory results  Recommend a lowfat, low carbohydrate diet divided over 5-6 small meals, increase water intake to 6-8 glasses, and 150 minutes per week of cardiovascular exercise.     Fat and Cholesterol Restricted Diet Getting too much fat and cholesterol in your diet may cause health problems. Following this diet helps keep your fat and cholesterol at normal levels. This can keep you from getting sick. WHAT TYPES OF FAT SHOULD I CHOOSE?  Choose monosaturated and polyunsaturated fats. These are found in foods such as olive oil, canola oil, flaxseeds, walnuts, almonds, and seeds.  Eat more omega-3 fats. Good choices include salmon, mackerel, sardines, tuna, flaxseed oil, and ground flaxseeds.  Limit saturated fats. These are in animal products such as meats, butter, and cream. They can also be in plant products such as palm oil, palm kernel oil, and coconut oil.   Avoid foods with partially hydrogenated oils in them. These contain trans fats. Examples of foods that have trans fats are stick margarine, some tub margarines, cookies, crackers, and other baked goods. WHAT GENERAL GUIDELINES DO I NEED TO FOLLOW?   Check food labels. Look for the words "trans fat" and "saturated fat."  When preparing a meal:  Fill half of your plate with vegetables and green salads.  Fill one fourth of your plate with whole grains. Look for the word "whole" as the first word in the ingredient list.  Fill one fourth of your plate with lean protein foods.  Limit fruit to two servings a day. Choose fruit instead of juice.  Eat more foods with soluble fiber. Examples of foods with this type of fiber are apples, broccoli, carrots, beans, peas, and barley. Try to get 20-30 g (grams) of fiber per day.  Eat more home-cooked foods. Eat  less at restaurants and buffets.  Limit or avoid alcohol.  Limit foods high in starch and sugar.  Limit fried foods.  Cook foods without frying them. Baking, boiling, grilling, and broiling are all great options.  Lose weight if you are overweight. Losing even a small amount of weight can help your overall health. It can also help prevent diseases such as diabetes and heart disease. WHAT FOODS CAN I EAT? Grains Whole grains, such as whole wheat or whole grain breads, crackers, cereals, and pasta. Unsweetened oatmeal, bulgur, barley, quinoa, or brown rice. Corn or whole wheat flour tortillas. Vegetables Fresh or frozen vegetables (raw, steamed, roasted, or grilled). Green salads. Fruits All fresh, canned (in natural juice), or frozen fruits. Meat and Other Protein Products Ground beef (85% or leaner), grass-fed beef, or beef trimmed of fat. Skinless chicken or [place]. Ground chicken or [place]. Pork trimmed of fat. All fish and seafood. Eggs. Dried beans, peas, or lentils. Unsalted nuts or seeds. Unsalted canned or dry beans. Dairy Low-fat dairy products, such as skim or 1% milk, 2% or reduced-fat cheeses, low-fat ricotta or cottage cheese, or plain low-fat yogurt. Fats and Oils Tub margarines without trans fats. Light or reduced-fat mayonnaise and salad dressings. Avocado. Olive, canola, sesame, or safflower oils. Natural peanut or almond butter (choose ones without added sugar and oil). The items listed above may not be a complete list of recommended foods or beverages. Contact your dietitian for more options. WHAT FOODS ARE NOT RECOMMENDED? Grains White bread. White pasta. White rice. Cornbread. Bagels, pastries, and  croissants. Crackers that contain trans fat. Vegetables White potatoes. Corn. Creamed or fried vegetables. Vegetables in a cheese sauce. Fruits Dried fruits. Canned fruit in light or heavy syrup. Fruit juice. Meat and Other Protein Products Fatty cuts of meat. Ribs,  chicken wings, bacon, sausage, bologna, salami, chitterlings, fatback, hot dogs, bratwurst, and packaged luncheon meats. Liver and organ meats. Dairy Whole or 2% milk, cream, half-and-half, and cream cheese. Whole milk cheeses. Whole-fat or sweetened yogurt. Full-fat cheeses. Nondairy creamers and whipped toppings. Processed cheese, cheese spreads, or cheese curds. Sweets and Desserts Corn syrup, sugars, honey, and molasses. Candy. Jam and jelly. Syrup. Sweetened cereals. Cookies, pies, cakes, donuts, muffins, and ice cream. Fats and Oils Butter, stick margarine, lard, shortening, ghee, or bacon fat. Coconut, palm kernel, or palm oils. Beverages Alcohol. Sweetened drinks (such as sodas, lemonade, and fruit drinks or punches). The items listed above may not be a complete list of foods and beverages to avoid. Contact your dietitian for more information.   This information is not intended to replace advice given to you by your health care provider. Make sure you discuss any questions you have with your health care provider.   Document Released: 10/14/2011 Document Revised: 05/05/2014 Document Reviewed: 07/14/2013 Elsevier Interactive Patient Education 2016 [place] DASH stands for "Dietary Approaches to Stop Hypertension." The DASH eating plan is a healthy eating plan that has been shown to reduce high blood pressure (hypertension). Additional health benefits may include reducing the risk of type 2 diabetes mellitus, heart disease, and stroke. The DASH eating plan may also help with weight loss. WHAT DO I NEED TO KNOW ABOUT THE DASH EATING PLAN? For the DASH eating plan, you will follow these general guidelines:  Choose foods with a percent daily value for sodium of less than 5% (as listed on the food label).  Use salt-free seasonings or herbs instead of table salt or sea salt.  Check with your health care provider or pharmacist before using salt substitutes.  Eat  lower-sodium products, often labeled as "lower sodium" or "no salt added."  Eat fresh foods.  Eat more vegetables, fruits, and low-fat dairy products.  Choose whole grains. Look for the word "whole" as the first word in the ingredient list.  Choose fish and skinless chicken or [place] more often than red meat. Limit fish, poultry, and meat to 6 oz (170 g) each day.  Limit sweets, desserts, sugars, and sugary drinks.  Choose heart-healthy fats.  Limit cheese to 1 oz (28 g) per day.  Eat more home-cooked food and less restaurant, buffet, and fast food.  Limit fried foods.  Cook foods using methods other than frying.  Limit canned vegetables. If you do use them, rinse them well to decrease the sodium.  When eating at a restaurant, ask that your food be prepared with less salt, or no salt if possible. WHAT FOODS CAN I EAT? Seek help from a dietitian for individual calorie needs. Grains Whole grain or whole wheat bread. Brown rice. Whole grain or whole wheat pasta. Quinoa, bulgur, and whole grain cereals. Low-sodium cereals. Corn or whole wheat flour tortillas. Whole grain cornbread. Whole grain crackers. Low-sodium crackers. Vegetables Fresh or frozen vegetables (raw, steamed, roasted, or grilled). Low-sodium or reduced-sodium tomato and vegetable juices. Low-sodium or reduced-sodium tomato sauce and paste. Low-sodium or reduced-sodium canned vegetables.  Fruits All fresh, canned (in natural juice), or frozen fruits. Meat and Other Protein Products Ground beef (85% or leaner), grass-fed beef, or beef trimmed  of fat. Skinless chicken or [place]. Ground chicken or [place]. Pork trimmed of fat. All fish and seafood. Eggs. Dried beans, peas, or lentils. Unsalted nuts and seeds. Unsalted canned beans. Dairy Low-fat dairy products, such as skim or 1% milk, 2% or reduced-fat cheeses, low-fat ricotta or cottage cheese, or plain low-fat yogurt. Low-sodium or reduced-sodium cheeses. Fats and  Oils Tub margarines without trans fats. Light or reduced-fat mayonnaise and salad dressings (reduced sodium). Avocado. Safflower, olive, or canola oils. Natural peanut or almond butter. Other Unsalted popcorn and pretzels. The items listed above may not be a complete list of recommended foods or beverages. Contact your dietitian for more options. WHAT FOODS ARE NOT RECOMMENDED? Grains White bread. White pasta. White rice. Refined cornbread. Bagels and croissants. Crackers that contain trans fat. Vegetables Creamed or fried vegetables. Vegetables in a cheese sauce. Regular canned vegetables. Regular canned tomato sauce and paste. Regular tomato and vegetable juices. Fruits Dried fruits. Canned fruit in light or heavy syrup. Fruit juice. Meat and Other Protein Products Fatty cuts of meat. Ribs, chicken wings, bacon, sausage, bologna, salami, chitterlings, fatback, hot dogs, bratwurst, and packaged luncheon meats. Salted nuts and seeds. Canned beans with salt. Dairy Whole or 2% milk, cream, half-and-half, and cream cheese. Whole-fat or sweetened yogurt. Full-fat cheeses or blue cheese. Nondairy creamers and whipped toppings. Processed cheese, cheese spreads, or cheese curds. Condiments Onion and garlic salt, seasoned salt, table salt, and sea salt. Canned and packaged gravies. Worcestershire sauce. Tartar sauce. Barbecue sauce. Teriyaki sauce. Soy sauce, including reduced sodium. Steak sauce. Fish sauce. Oyster sauce. Cocktail sauce. Horseradish. Ketchup and mustard. Meat flavorings and tenderizers. Bouillon cubes. Hot sauce. Tabasco sauce. Marinades. Taco seasonings. Relishes. Fats and Oils Butter, stick margarine, lard, shortening, ghee, and bacon fat. Coconut, palm kernel, or palm oils. Regular salad dressings. Other Pickles and olives. Salted popcorn and pretzels. The items listed above may not be a complete list of foods and beverages to avoid. Contact your dietitian for more  information. WHERE CAN I FIND MORE INFORMATION? National Heart, Lung, and Blood Institute: travelstabloid.com   This information is not intended to replace advice given to you by your health care provider. Make sure you discuss any questions you have with your health care provider.   Document Released: 04/03/2011 Document Revised: 05/05/2014 Document Reviewed: 02/16/2013 Elsevier Interactive Patient Education [employer].

## 2015-03-15 NOTE — Progress Notes (Signed)
Subjective:    Patient ID: Jacqueline Lowe, female    DOB: [birth year], 39 y.o.   MRN: [id number]  HPI Jacqueline Lowe, 39 year old female presents to establish care. Ms. Jacqueline Lowe relocated to area from [place], [place]. Patient has a history of HIV and uncontrolled hypertension. She is a patient of Dr. Scharlene Gloss. She was last seen by Dr. Linus Salmons on 10.09/2014. She has been controlled on Stribild and reports that she takes medication consistently.   Patient has a history of uncontrolled hypertension. She states that she was on hydrochlorothiazide several years ago, but has been off of medication.  She is not exercising and is adherent to low salt diet. Patient denies chest pain, dyspnea, near-syncope, orthopnea, palpitations and syncope.  Cardiovascular risk factors: dyslipidemia.   Past Medical History  Diagnosis Date  . Cervical cancer (HCC)     dx at age 45  . HIV (human immunodeficiency virus infection) ([place])     reported to be HIV+ by patient   There is no immunization history on file for this patient.  Review of Systems  Constitutional: Positive for fatigue. Negative for fever.  Eyes: Positive for visual disturbance.  Cardiovascular: Negative.  Negative for chest pain, palpitations and leg swelling.  Gastrointestinal: Negative.   Endocrine: Negative for polydipsia, polyphagia and polyuria.  Musculoskeletal: Negative.   Skin: Negative.   Allergic/Immunologic: Positive for immunocompromised state (HIV + since 2000).  Neurological: Negative.   Hematological: Negative.   Psychiatric/Behavioral: Negative.        Objective:   Physical Exam  Constitutional: She is oriented to person, place, and time. She appears well-developed and well-nourished.  HENT:  Head: Normocephalic and atraumatic.  Right Ear: External ear normal.  Left Ear: External ear normal.  Nose: Nose normal.  Mouth/Throat: Oropharynx is clear and moist. No oropharyngeal exudate.  Eyes: Conjunctivae and EOM are  normal. Pupils are equal, round, and reactive to light.  Neck: Normal range of motion. Neck supple.  Cardiovascular: Normal rate, regular rhythm, normal heart sounds and intact distal pulses.   Pulmonary/Chest: Effort normal and breath sounds normal.  Abdominal: Soft. Bowel sounds are normal.  Musculoskeletal: Normal range of motion.  Neurological: She is alert and oriented to person, place, and time. She has normal reflexes.  Skin: Skin is warm and dry.  Psychiatric: She has a normal mood and affect. Her behavior is normal. Judgment and thought content normal.         BP 160/96 mmHg  Pulse 100  Temp(Src) 98.5 F (36.9 C) (Oral)  Resp 16  Ht 5\' 4"  (1.626 m)  Wt 175 lb (79.379 kg)  BMI 30.02 kg/m2  LMP 10/02/2011 Assessment & Plan:  1. Uncontrolled hypertension Blood pressure was 162/122. Patient received Clonidine 0.1 mg and was observed for 30 minutes. BP decreased to 160/96. Will start Amlodipine 5 mg daily. She will follow up in office in 2 weeks. The patient is asked to make an attempt to improve diet and exercise patterns to aid in medical management of this problem. - cloNIDine (CATAPRES) tablet 0.1 mg; Take 1 tablet (0.1 mg total) by mouth once. - EKG 12-Lead - POCT urinalysis dipstick - Lipid Panel - COMPLETE METABOLIC PANEL WITH GFR - amLODipine (NORVASC) 5 MG tablet; Take 1 tablet (5 mg total) by mouth daily.  Dispense: 30 tablet; Refill: 0  2. History of anemia - CBC with Differential  3. Hyperglycemia  - Glucose (CBG) - Hemoglobin A1C  4. Hyperlipidemia Reviewed previous lipid panel total  cholesterol is 218, goal is < 200, triglycerides are 226, goal is < 150.  - Fenofibrate 40 MG TABS; Take 1 tablet (40 mg total) by mouth daily.  Dispense: 30 tablet; Refill: 2 - aspirin EC 81 MG tablet; Take 1 tablet (81 mg total) by mouth daily.  Dispense: 30 tablet; Refill: 11  5. Human immunodeficiency virus disease ([place]) Reviewed previous labs. Patient to continue  medication as prescribed by Dr. Jerl Santos  6. Routine health maintenance Recommend monthly self breast exams Recommend a lowfat, low carbohydrate diet divided over 5-6 small meals, increase water intake to 6-8 glasses, and 150 minutes per week of cardiovascular exercise.    The patient was given clear instructions to go to ER or return to medical center if symptoms do not improve, worsen or new problems develop. The patient verbalized understanding. Will notify patient with laboratory results. RTC: 2 weeks Ladora Osterberg M, FNP

## 2015-03-16 LAB — HEMOGLOBIN A1C
Hgb A1c MFr Bld: 5.7 % — ABNORMAL HIGH (ref <5.7)
MEAN PLASMA GLUCOSE: 117 mg/dL — AB (ref <117)

## 2015-03-29 ENCOUNTER — Encounter: Payer: Self-pay | Admitting: Family Medicine

## 2015-03-29 ENCOUNTER — Other Ambulatory Visit: Payer: Self-pay

## 2015-03-29 ENCOUNTER — Ambulatory Visit (INDEPENDENT_AMBULATORY_CARE_PROVIDER_SITE_OTHER): Payer: Medicare Other | Admitting: Family Medicine

## 2015-03-29 VITALS — BP 122/68

## 2015-03-29 DIAGNOSIS — M2550 Pain in unspecified joint: Secondary | ICD-10-CM

## 2015-03-29 DIAGNOSIS — I10 Essential (primary) hypertension: Secondary | ICD-10-CM

## 2015-03-29 LAB — RHEUMATOID FACTOR: Rhuematoid fact SerPl-aCnc: <10 IU/mL (ref <=14)

## 2015-03-29 LAB — C-REACTIVE PROTEIN

## 2015-03-29 MED ORDER — AMLODIPINE BESYLATE 5 MG PO TABS: 5.0000 mg | ORAL_TABLET | Freq: Every day | ORAL | Status: DC

## 2015-03-29 NOTE — Progress Notes (Signed)
Subjective:    Patient ID: Jacqueline Lowe, female    DOB: 02-Sep-1975, 39 y.o.   MRN: [id number]  HPI Jacqueline Lowe 39 year old female with a history of HIV and hypertension presents for a follow up of hypertension after starting medications 2 weeks ago. Jacqueline Lowe states that she has been taking medication consistently. She has not been checking blood pressures at home.  She has also been exercising and following a low fat diet. She denies dizziness, chest pains, palpitations, lower extremity edema, nausea, vomiting, or diarrhea.  Jacqueline Lowe is also complaining of wide spread joint pains. She states that joint pain has been occuring periodically since 2005. She denies a family history of rheumatoid arthritis and has not been diagnosed with arthritis in the past. Patient reports that joint pain is worsened with activity and upon awakening. She has not attempted OTC interventions to alleviate current symptoms. She denies fever, fatigue, muscles spasms, or paresthesias.   Past Medical History  Diagnosis Date  . Cervical cancer (HCC)     dx at age 40  . HIV (human immunodeficiency virus infection) ([place])     reported to be HIV+ by patient   Social History   Social History  . Marital Status: Single    Spouse Name: N/A  . Number of Children: N/A  . Years of Education: N/A   Occupational History  . Not on file.   Social History Main Topics  . Smoking status: Former Research scientist (life sciences)  . Smokeless tobacco: Never Used  . Alcohol Use: 0.0 oz/week    0 Standard drinks or equivalent per week     Comment: socially  . Drug Use: No  . Sexual Activity: Yes     Comment: declined condoms   Other Topics Concern  . Not on file   Social History Narrative   Review of Systems  Constitutional: Negative.  Negative for fever, fatigue and unexpected weight change.  HENT: Negative.   Eyes: Negative.  Negative for visual disturbance.  Respiratory: Negative.   Cardiovascular: Negative.  Negative  for palpitations and leg swelling.  Gastrointestinal: Negative.  Negative for constipation.  Endocrine: Negative.  Negative for polydipsia, polyphagia and polyuria.  Genitourinary: Negative.  Negative for urgency.  Musculoskeletal: Positive for arthralgias (primarily to upper and lower extremities).  Skin: Negative.   Allergic/Immunologic: Negative.   Neurological: Negative.  Negative for dizziness.  Hematological: Negative.   Psychiatric/Behavioral: Negative.       Objective:   Physical Exam  Constitutional: She is oriented to person, place, and time. She appears well-developed and well-nourished.  HENT:  Head: Normocephalic and atraumatic.  Right Ear: External ear normal.  Left Ear: External ear normal.  Mouth/Throat: Oropharynx is clear and moist.  Eyes: Conjunctivae and EOM are normal. Pupils are equal, round, and reactive to light.  Neck: Normal range of motion. Neck supple.  Cardiovascular: Normal rate, regular rhythm, normal heart sounds and intact distal pulses.   Pulmonary/Chest: Effort normal and breath sounds normal.  Abdominal: Soft. Bowel sounds are normal.  Musculoskeletal: Normal range of motion.       Right elbow: She exhibits normal range of motion and no swelling.       Left elbow: She exhibits normal range of motion and no swelling.       Right knee: Tenderness found. Medial joint line and lateral joint line tenderness noted.       Left knee: Tenderness found. Medial joint line and lateral joint line tenderness noted.  Neurological:  She is alert and oriented to person, place, and time. She has normal reflexes.  Skin: Skin is warm and dry.  Psychiatric: She has a normal mood and affect. Her behavior is normal. Judgment and thought content normal.     Assessment & Plan:  1. Uncontrolled hypertension Blood pressure is at goal on current medication regimen. Will continue Amlodipine 5 mg as previously prescribed. Reviewed previous urinalysis, no proteinuria  present. The patient is asked to make an attempt to improve diet and exercise patterns to aid in medical management of this problem.  2. Arthralgia of multiple sites Recommend OTC Tylenol 500 mg every 5 hours as needed for mild-moderate joint pain. Will notify patient via telephone to discuss lab results as they become available.  - Rheumatoid factor - Sedimentation Rate - C-reactive protein  RTC: 3 months for hypertension  The patient was given clear instructions to go to ER or return to medical center if symptoms do not improve, worsen or new problems develop. The patient verbalized understanding. Will notify patient with laboratory results. [name], FNP

## 2015-03-29 NOTE — Patient Instructions (Addendum)
  Recommend Tylenol 500 mg every 6 hours as needed for joint pain. Will call patient with laboratory results.  Joint Pain Joint pain, which is also called arthralgia, can be caused by many things. Joint pain often goes away when you follow your health care provider's instructions for relieving pain at home. However, joint pain can also be caused by conditions that require further treatment. Common causes of joint pain include:  Bruising in the area of the joint.  Overuse of the joint.  Wear and tear on the joints that occur with aging (osteoarthritis).  Various other forms of arthritis.  A buildup of a crystal form of uric acid in the joint (gout).  Infections of the joint (septic arthritis) or of the bone (osteomyelitis). Your health care provider may recommend medicine to help with the pain. If your joint pain continues, additional tests may be needed to diagnose your condition. HOME CARE INSTRUCTIONS Watch your condition for any changes. Follow these instructions as directed to lessen the pain that you are feeling.  Take medicines only as directed by your health care provider.  Rest the affected area for as long as your health care provider says that you should. If directed to do so, raise the painful joint above the level of your heart while you are sitting or lying down.  Do not do things that cause or worsen pain.  If directed, apply ice to the painful area:  Put ice in a plastic bag.  Place a towel between your skin and the bag.  Leave the ice on for 20 minutes, 2-3 times per day.  Wear an elastic bandage, splint, or sling as directed by your health care provider. Loosen the elastic bandage or splint if your fingers or toes become numb and tingle, or if they turn cold and blue.  Begin exercising or stretching the affected area as directed by your health care provider. Ask your health care provider what types of exercise are safe for you.  Keep all follow-up visits as  directed by your health care provider. This is important. SEEK MEDICAL CARE IF:  Your pain increases, and medicine does not help.  Your joint pain does not improve within 3 days.  You have increased bruising or swelling.  You have a fever.  You lose 10 lb (4.5 kg) or more without trying. SEEK IMMEDIATE MEDICAL CARE IF:  You are not able to move the joint.  Your fingers or toes become numb or they turn cold and blue.   This information is not intended to replace advice given to you by your health care provider. Make sure you discuss any questions you have with your health care provider.   Document Released: 04/14/2005 Document Revised: 05/05/2014 Document Reviewed: 01/24/2014 Elsevier Interactive Patient Education [employer].

## 2015-03-30 ENCOUNTER — Telehealth: Payer: Self-pay

## 2015-03-30 LAB — SEDIMENTATION RATE: Sed Rate: 19 mm/hr (ref 0–20)

## 2015-03-30 NOTE — Telephone Encounter (Signed)
-----   Message from [name], FNP sent at 03/30/2015  6:31 AM EST ----- Please inform Jacqueline Lowe that all tests for arthritis were within normal parameters. Please start Tylenol 500 mg every 6 hours as needed for mild to moderate joint pain. Refrain from OTC Ibuprofen due to current medication regimen. Follow up in office as scheduled or as needed.   Thanks ----- Message -----    From: [facility]    Sent: 03/30/2015   2:30 AM      To: [name], FNP

## 2015-03-30 NOTE — Telephone Encounter (Signed)
Called and spoke with patient advised of test results and to take tylenol 500mg  every 6 hours as needed for mild to moderate joint pain. Ask patient to stay away from taking ibuprofen due to current prescriptions she is on. Patient verbalized understanding. Thanks!

## 2015-05-29 ENCOUNTER — Other Ambulatory Visit: Payer: Self-pay | Admitting: Family Medicine

## 2015-05-29 DIAGNOSIS — H359 Unspecified retinal disorder: Secondary | ICD-10-CM | POA: Diagnosis not present

## 2015-05-29 DIAGNOSIS — Z21 Asymptomatic human immunodeficiency virus [HIV] infection status: Secondary | ICD-10-CM | POA: Diagnosis not present

## 2015-05-29 DIAGNOSIS — E785 Hyperlipidemia, unspecified: Secondary | ICD-10-CM

## 2015-05-29 DIAGNOSIS — H59813 Chorioretinal scars after surgery for detachment, bilateral: Secondary | ICD-10-CM | POA: Diagnosis not present

## 2015-05-30 MED ORDER — FENOFIBRATE 40 MG PO TABS: 40.0000 mg | ORAL_TABLET | Freq: Every day | ORAL | Status: DC

## 2015-05-30 NOTE — Telephone Encounter (Signed)
This has been refilled.  Thanks. 

## 2015-06-14 ENCOUNTER — Encounter: Payer: Self-pay | Admitting: Internal Medicine

## 2015-06-14 ENCOUNTER — Ambulatory Visit (INDEPENDENT_AMBULATORY_CARE_PROVIDER_SITE_OTHER): Payer: Medicare Other | Admitting: Internal Medicine

## 2015-06-14 VITALS — Temp 98.0°F | Ht 64.0 in | Wt 175.0 lb

## 2015-06-14 DIAGNOSIS — B20 Human immunodeficiency virus [HIV] disease: Secondary | ICD-10-CM

## 2015-06-14 DIAGNOSIS — I10 Essential (primary) hypertension: Secondary | ICD-10-CM

## 2015-06-14 MED ORDER — ELVITEG-COBIC-EMTRICIT-TENOFAF 150-150-200-10 MG PO TABS: 1.0000 | ORAL_TABLET | Freq: Every day | ORAL | Status: DC

## 2015-06-14 NOTE — Assessment & Plan Note (Signed)
Now on medication and improved.

## 2015-06-14 NOTE — Progress Notes (Signed)
   Subjective:    Patient ID: Jacqueline Lowe, female    DOB: [birth year], 40 y.o.   MRN: [id number]  HPI Here for follow up of HIV.   On Stribild and denies any missed doses.  Since last visit has been to see a new PCP and pleased with it. BP better.  No missed doses.     Review of Systems  Constitutional: Negative for fatigue.  Gastrointestinal: Negative for nausea and diarrhea.  Skin: Negative for rash.  Neurological: Negative for dizziness and light-headedness.       Objective:   Physical Exam  Constitutional: She appears well-developed and well-nourished.  Eyes: No scleral icterus.  Cardiovascular: Normal rate, regular rhythm and normal heart sounds.   No murmur heard. Pulmonary/Chest: Effort normal and breath sounds normal. No respiratory distress.  Skin: No rash noted.          Assessment & Plan:

## 2015-06-14 NOTE — Assessment & Plan Note (Signed)
Doing well.  Will change to [facility] with next refill.  Discussed bone density and renal issues decreased with TAF

## 2015-06-15 LAB — T-HELPER CELL (CD4) - (RCID CLINIC ONLY)
CD4 % Helper T Cell: 35 % (ref 33–55)
CD4 T Cell Abs: 750 /uL (ref 400–2700)

## 2015-06-15 LAB — HIV-1 RNA QUANT-NO REFLEX-BLD: HIV 1 RNA Quant: <20 copies/mL (ref <20)

## 2015-06-26 ENCOUNTER — Ambulatory Visit (INDEPENDENT_AMBULATORY_CARE_PROVIDER_SITE_OTHER): Payer: Medicare Other | Admitting: Family Medicine

## 2015-06-26 VITALS — BP 122/80 | HR 84 | Temp 98.7°F | Resp 16 | Ht 64.0 in | Wt 172.0 lb

## 2015-06-26 DIAGNOSIS — B20 Human immunodeficiency virus [HIV] disease: Secondary | ICD-10-CM

## 2015-06-26 DIAGNOSIS — R109 Unspecified abdominal pain: Secondary | ICD-10-CM | POA: Diagnosis not present

## 2015-06-26 DIAGNOSIS — Z8541 Personal history of malignant neoplasm of cervix uteri: Secondary | ICD-10-CM

## 2015-06-26 DIAGNOSIS — I10 Essential (primary) hypertension: Secondary | ICD-10-CM | POA: Insufficient documentation

## 2015-06-26 LAB — CBC WITH DIFFERENTIAL/PLATELET
BASOS ABS: 0.1 10*3/uL (ref 0.0–0.1)
BASOS PCT: 1 % (ref 0–1)
EOS ABS: 0.1 10*3/uL (ref 0.0–0.7)
Eosinophils Relative: 2 % (ref 0–5)
HCT: 39.5 % (ref 36.0–46.0)
Hemoglobin: 13.8 g/dL (ref 12.0–15.0)
LYMPHS ABS: 1.9 10*3/uL (ref 0.7–4.0)
Lymphocytes Relative: 36 % (ref 12–46)
MCH: 30.5 pg (ref 26.0–34.0)
MCHC: 34.9 g/dL (ref 30.0–36.0)
MCV: 87.4 fL (ref 78.0–100.0)
MPV: 10.1 fL (ref 8.6–12.4)
Monocytes Absolute: 0.5 10*3/uL (ref 0.1–1.0)
Monocytes Relative: 10 % (ref 3–12)
NEUTROS PCT: 51 % (ref 43–77)
Neutro Abs: 2.7 10*3/uL (ref 1.7–7.7)
PLATELETS: 415 10*3/uL — AB (ref 150–400)
RBC: 4.52 MIL/uL (ref 3.87–5.11)
RDW: 13.4 % (ref 11.5–15.5)
WBC: 5.2 10*3/uL (ref 4.0–10.5)

## 2015-06-26 LAB — POCT URINALYSIS DIP (DEVICE)
Bilirubin Urine: NEGATIVE
Glucose, UA: NEGATIVE mg/dL
HGB URINE DIPSTICK: NEGATIVE
Ketones, ur: NEGATIVE mg/dL
Leukocytes, UA: NEGATIVE
NITRITE: NEGATIVE
PH: 7 (ref 5.0–8.0)
Protein, ur: NEGATIVE mg/dL
SPECIFIC GRAVITY, URINE: 1.02 (ref 1.005–1.030)
UROBILINOGEN UA: 0.2 mg/dL (ref 0.0–1.0)

## 2015-06-26 LAB — COMPLETE METABOLIC PANEL WITH GFR
ALT: 11 U/L (ref 6–29)
AST: 15 U/L (ref 10–30)
Albumin: 4.7 g/dL (ref 3.6–5.1)
Alkaline Phosphatase: 70 U/L (ref 33–115)
BILIRUBIN TOTAL: 0.3 mg/dL (ref 0.2–1.2)
BUN: 7 mg/dL (ref 7–25)
CO2: 25 mmol/L (ref 20–31)
CREATININE: 0.75 mg/dL (ref 0.50–1.10)
Calcium: 10.5 mg/dL — ABNORMAL HIGH (ref 8.6–10.2)
Chloride: 104 mmol/L (ref 98–110)
GFR, Est African American: >89 mL/min (ref >=60)
GFR, Est Non African American: >89 mL/min (ref >=60)
GLUCOSE: 100 mg/dL — AB (ref 65–99)
Potassium: 4.2 mmol/L (ref 3.5–5.3)
SODIUM: 137 mmol/L (ref 135–146)
TOTAL PROTEIN: 8.4 g/dL — AB (ref 6.1–8.1)

## 2015-06-26 MED ORDER — AMLODIPINE BESYLATE 5 MG PO TABS: 5.0000 mg | ORAL_TABLET | Freq: Every day | ORAL | Status: DC

## 2015-06-26 NOTE — Progress Notes (Signed)
Subjective:    Patient ID: Jacqueline Lowe, female    DOB: 16-Nov-1975, 40 y.o.   MRN: [id number]  HPI [name] 40 year old female with a history of HIV and hypertension presents for a follow up of hypertension. Ms. Demeester states that she has been taking medication consistently. She has not been checking blood pressures at home.  She has also been exercising and following a low fat diet. She denies dizziness, chest pains, palpitations, lower extremity edema, nausea, vomiting, or diarrhea.  Ms. Minar is also complaining of pelvic pain and abdominal discomfort. The pain is described as dull, and is 3/10 in intensity. Pain is located in the right and left lower quadrants. She has not identified any palliative or provocative factors. .  Patient expresses concern due to history of cervical cancer. She was diagnosed with cervical cancer at age 11. She had a partial hysterectomy and ovaries remain intact. She states that she has not had a follow up of cervical cancer since relocating to area.  She denies fever, fatigue, constipation, dysuria, nausea, vomiting, or diarrhea.  Past Medical History  Diagnosis Date  . Cervical cancer (HCC)     dx at age 76  . HIV (human immunodeficiency virus infection) ([place])     reported to be HIV+ by patient   Social History   Social History  . Marital Status: Single    Spouse Name: N/A  . Number of Children: N/A  . Years of Education: N/A   Occupational History  . Not on file.   Social History Main Topics  . Smoking status: Former Research scientist (life sciences)  . Smokeless tobacco: Never Used  . Alcohol Use: 0.0 oz/week    0 Standard drinks or equivalent per week     Comment: socially  . Drug Use: No  . Sexual Activity: Yes     Comment: declined condoms   Other Topics Concern  . Not on file   Social History Narrative   Past Surgical History  Procedure Laterality Date  . Abdominal hysterectomy      excessive bleeding; ovaries remain intact   Review  of Systems  Constitutional: Negative.  Negative for fatigue and unexpected weight change.  HENT: Negative.   Eyes: Negative.  Negative for visual disturbance.  Respiratory: Negative.   Cardiovascular: Negative.  Negative for leg swelling.  Gastrointestinal: Positive for abdominal pain. Negative for nausea, vomiting, diarrhea, constipation, blood in stool, abdominal distention and rectal pain.  Endocrine: Negative.  Negative for polydipsia, polyphagia and polyuria.  Genitourinary: Positive for pelvic pain. Negative for dysuria and urgency.  Skin: Negative.   Allergic/Immunologic: Negative.  Negative for immunocompromised state.  Neurological: Negative.  Negative for dizziness and headaches.  Hematological: Negative.   Psychiatric/Behavioral: Negative.  Negative for agitation.      Objective:   Physical Exam  Constitutional: She is oriented to person, place, and time. She appears well-developed and well-nourished.  HENT:  Head: Normocephalic and atraumatic.  Right Ear: External ear normal.  Left Ear: External ear normal.  Mouth/Throat: Oropharynx is clear and moist.  Eyes: Conjunctivae and EOM are normal. Pupils are equal, round, and reactive to light.  Neck: Normal range of motion. Neck supple.  Cardiovascular: Normal rate, regular rhythm, normal heart sounds and intact distal pulses.   Pulmonary/Chest: Effort normal and breath sounds normal.  Abdominal: Soft. Bowel sounds are normal. She exhibits no mass. There is tenderness in the right lower quadrant and left lower quadrant. There is no CVA tenderness.  Musculoskeletal:  Normal range of motion.  Neurological: She is alert and oriented to person, place, and time. She has normal reflexes.  Skin: Skin is warm and dry.  Psychiatric: She has a normal mood and affect. Her behavior is normal. Judgment and thought content normal.    BP 122/80 mmHg  Pulse 84  Temp(Src) 98.7 F (37.1 C) (Oral)  Resp 16  Ht 5\' 4"  (1.626 m)  Wt 172 lb  (78.019 kg)  BMI 29.51 kg/m2  LMP 10/02/2011 Assessment & Plan:   1. Essential hypertension Blood pressure is at goal on current medication regimen. Will continue medication at current dosage. No proteinuria present.  - amLODipine (NORVASC) 5 MG tablet; Take 1 tablet (5 mg total) by mouth daily.  Dispense: 30 tablet; Refill: 2 - POCT urinalysis dipstick - COMPLETE METABOLIC PANEL WITH GFR  2. Human immunodeficiency virus disease ([place]) Patient is to follow up with Dr. Linus Salmons as scheduled.   3. History of cervical cancer - Ambulatory referral to Gynecology  4. Abdominal discomfort Will review labs and follow up with patient by phone.  - Ambulatory referral to Gynecology - POCT urinalysis dipstick - CBC with Differential - COMPLETE METABOLIC PANEL WITH GFR   RTC: 3 months for hypertension  The patient was given clear instructions to go to ER or return to medical center if symptoms do not improve, worsen or new problems develop. The patient verbalized understanding. Will notify patient with laboratory results. [name], FNP

## 2015-06-27 ENCOUNTER — Telehealth: Payer: Self-pay

## 2015-06-27 NOTE — Telephone Encounter (Signed)
-----   Message from [name], [place] sent at 06/27/2015  6:57 AM EST ----- Regarding: lab results Please inform patient that all labs are within a normal range. Continue antihypertensive medications as previously prescribed. Please follow up in office as scheduled.   Thanks ----- Message -----    From: [facility]    Sent: 06/26/2015   9:46 PM      To: [name], FNP

## 2015-06-27 NOTE — Telephone Encounter (Signed)
Called and left a message of normal labs, to keep taking medication as prescribed, and keep follow up appointment. Asked if any questions to return call. Thanks!

## 2015-06-28 ENCOUNTER — Encounter: Payer: Self-pay | Admitting: Family Medicine

## 2015-06-28 DIAGNOSIS — Z8541 Personal history of malignant neoplasm of cervix uteri: Secondary | ICD-10-CM | POA: Insufficient documentation

## 2015-08-30 ENCOUNTER — Other Ambulatory Visit: Payer: Self-pay | Admitting: Family Medicine

## 2015-09-19 ENCOUNTER — Other Ambulatory Visit: Payer: Self-pay | Admitting: Family Medicine

## 2015-09-19 ENCOUNTER — Other Ambulatory Visit (INDEPENDENT_AMBULATORY_CARE_PROVIDER_SITE_OTHER): Payer: Medicare Other

## 2015-09-19 DIAGNOSIS — E785 Hyperlipidemia, unspecified: Secondary | ICD-10-CM | POA: Diagnosis not present

## 2015-09-19 LAB — LIPID PANEL
Cholesterol: 268 mg/dL — ABNORMAL HIGH (ref 125–200)
HDL: 30 mg/dL — ABNORMAL LOW (ref >=46)
LDL Cholesterol: 208 mg/dL — ABNORMAL HIGH (ref <130)
Total CHOL/HDL Ratio: 8.9 Ratio — ABNORMAL HIGH (ref <=5.0)
Triglycerides: 150 mg/dL — ABNORMAL HIGH (ref <150)
VLDL: 30 mg/dL (ref <30)

## 2015-09-20 ENCOUNTER — Other Ambulatory Visit: Payer: Self-pay | Admitting: Family Medicine

## 2015-09-20 DIAGNOSIS — E785 Hyperlipidemia, unspecified: Secondary | ICD-10-CM

## 2015-09-20 MED ORDER — FENOFIBRATE 145 MG PO TABS: 145.0000 mg | ORAL_TABLET | Freq: Every day | ORAL | Status: DC

## 2015-09-20 NOTE — Progress Notes (Signed)
Tried to call no answer, Left message for patient to return call. Thanks!

## 2015-09-20 NOTE — Progress Notes (Signed)
Patient returned call, I advised of labs and to increase tricor to 145mg  daily and continue to work on diet and exercise. Patient verbalized understanding. Thanks!

## 2015-09-26 ENCOUNTER — Encounter: Payer: Self-pay | Admitting: Family Medicine

## 2015-09-26 ENCOUNTER — Ambulatory Visit (INDEPENDENT_AMBULATORY_CARE_PROVIDER_SITE_OTHER): Payer: Medicare Other | Admitting: Family Medicine

## 2015-09-26 VITALS — BP 122/75 | HR 89 | Temp 98.3°F | Resp 14 | Ht 64.0 in | Wt 171.0 lb

## 2015-09-26 DIAGNOSIS — I10 Essential (primary) hypertension: Secondary | ICD-10-CM | POA: Diagnosis not present

## 2015-09-26 DIAGNOSIS — E785 Hyperlipidemia, unspecified: Secondary | ICD-10-CM

## 2015-09-26 DIAGNOSIS — B20 Human immunodeficiency virus [HIV] disease: Secondary | ICD-10-CM | POA: Diagnosis not present

## 2015-09-26 NOTE — Progress Notes (Signed)
Subjective:    Patient ID: Jacqueline Lowe, female    DOB: 07-23-1975, 40 y.o.   MRN: [id number]  HPI Ms. Jacqueline Lowe 40 year old female with a history of HIV and hypertension presents for a follow up of hypertension and hyperlipidemia. Jacqueline Lowe states that she has been taking medication consistently. Reviewed previous lipid profile, total cholesterol is 268, goal is < 200, LDL cholesterol is 208, goal is < 100. She has not been following a low fat diet or exercise regimen.  Jacqueline Lowe also has a history of hypertension. She has not been checking blood pressures at home. She denies dizziness, chest pains, palpitations, lower extremity edema, nausea, vomiting, or diarrhea.    Past Medical History  Diagnosis Date  . Cervical cancer (HCC)     dx at age 32  . HIV (human immunodeficiency virus infection) ([place])     reported to be HIV+ by patient   Social History   Social History  . Marital Status: Single    Spouse Name: N/A  . Number of Children: N/A  . Years of Education: N/A   Occupational History  . Not on file.   Social History Main Topics  . Smoking status: Former Research scientist (life sciences)  . Smokeless tobacco: Never Used  . Alcohol Use: 0.0 oz/week    0 Standard drinks or equivalent per week     Comment: socially  . Drug Use: No  . Sexual Activity: Yes     Comment: declined condoms   Other Topics Concern  . Not on file   Social History Narrative   Past Surgical History  Procedure Laterality Date  . Abdominal hysterectomy      excessive bleeding; ovaries remain intact   Review of Systems  Constitutional: Negative.  Negative for fatigue and unexpected weight change.  HENT: Negative.   Eyes: Negative.  Negative for visual disturbance.  Respiratory: Negative.   Cardiovascular: Negative.  Negative for leg swelling.  Gastrointestinal: Negative for nausea, vomiting, diarrhea, constipation, blood in stool, abdominal distention and rectal pain.  Endocrine: Negative.   Negative for polydipsia, polyphagia and polyuria.  Genitourinary: Negative for dysuria and urgency.  Musculoskeletal: Negative.   Skin: Negative.   Allergic/Immunologic: Negative.  Negative for immunocompromised state.  Neurological: Negative.  Negative for dizziness and headaches.  Hematological: Negative.   Psychiatric/Behavioral: Negative.  Negative for agitation.      Objective:   Physical Exam  Constitutional: She is oriented to person, place, and time. She appears well-developed and well-nourished.  HENT:  Head: Normocephalic and atraumatic.  Right Ear: External ear normal.  Left Ear: External ear normal.  Mouth/Throat: Oropharynx is clear and moist.  Eyes: Conjunctivae and EOM are normal. Pupils are equal, round, and reactive to light.  Neck: Normal range of motion. Neck supple.  Cardiovascular: Normal rate, regular rhythm, normal heart sounds and intact distal pulses.   Pulmonary/Chest: Effort normal and breath sounds normal.  Abdominal: Soft. Bowel sounds are normal.  Musculoskeletal: Normal range of motion.  Neurological: She is alert and oriented to person, place, and time. She has normal reflexes.  Skin: Skin is warm and dry.  Psychiatric: She has a normal mood and affect. Her behavior is normal. Judgment and thought content normal.    BP 122/75 mmHg  Pulse 89  Temp(Src) 98.3 F (36.8 C) (Oral)  Resp 14  Ht 5\' 4"  (1.626 m)  Wt 171 lb (77.565 kg)  BMI 29.34 kg/m2  SpO2 100%  LMP 10/02/2011 Assessment & Plan:  1. Essential hypertension Blood pressure is at goal on current medication regimen. Will continue medication at current dosage. No proteinuria present.   2. Hyperlipidemia A low fat, low cholesterol is discussed with the patient, and a written copy is given to her.  3. Human immunodeficiency virus disease ([place]) Recommend that patient follow up with Dr. Linus Salmons as scheduled    RTC: 3 months for hypertension and hyperlipidemia  The patient was given  clear instructions to go to ER or return to medical center if symptoms do not improve, worsen or new problems develop. The patient verbalized understanding. Will notify patient with laboratory results. Jacqueline Dew, FNP

## 2015-09-26 NOTE — Patient Instructions (Signed)
Cholesterol  Cholesterol is a white, waxy, fat-like substance needed by your body in small amounts. The liver makes all the cholesterol you need. Cholesterol is carried from the liver by the blood through the blood vessels. Deposits of cholesterol (plaque) may build up on blood vessel walls. These make the arteries narrower and stiffer. Cholesterol plaques increase the risk for heart attack and stroke.   You cannot feel your cholesterol level even if it is very high. The only way to know it is high is with a blood test. Once you know your cholesterol levels, you should keep a record of the test results. Work with your health care provider to keep your levels in the desired range.   WHAT DO THE RESULTS MEAN?  · Total cholesterol is a rough measure of all the cholesterol in your blood.    · LDL is the so-called bad cholesterol. This is the type that deposits cholesterol in the walls of the arteries. You want this level to be low.    · HDL is the good cholesterol because it cleans the arteries and carries the LDL away. You want this level to be high.  · Triglycerides are fat that the body can either burn for energy or store. High levels are closely linked to heart disease.    WHAT ARE THE DESIRED LEVELS OF CHOLESTEROL?  · Total cholesterol below 200.    · LDL below 100 for people at risk, below 70 for those at very high risk.    · HDL above 50 is good, above 60 is best.    · Triglycerides below 150.    HOW CAN I LOWER MY CHOLESTEROL?  · Diet. Follow your diet programs as directed by your health care provider.    · Choose fish or white meat chicken and turkey, roasted or baked. Limit fatty cuts of red meat, fried foods, and processed meats, such as sausage and lunch meats.    · Eat lots of fresh fruits and vegetables.  · Choose whole grains, beans, pasta, potatoes, and cereals.    · Use only small amounts of olive, corn, or canola oils.    · Avoid butter, mayonnaise, shortening, or palm kernel oils.  · Avoid foods with  trans fats.    · Drink skim or nonfat milk and eat low-fat or nonfat yogurt and cheeses. Avoid whole milk, cream, ice cream, egg yolks, and full-fat cheeses.    · Healthy desserts include angel food cake, ginger snaps, animal crackers, hard candy, popsicles, and low-fat or nonfat frozen yogurt. Avoid pastries, cakes, pies, and cookies.    · Exercise. Follow your exercise programs as directed by your health care provider.    · A regular program helps decrease LDL and raise HDL.    · A regular program helps with weight control.    · Do things that increase your activity level like gardening, walking, or taking the stairs. Ask your health care provider about how you can be more active in your daily life.    · Medicine. Take medicine only as directed by your health care provider.    · Medicine may be prescribed by your health care provider to help lower cholesterol and decrease the risk for heart disease.    · If you have several risk factors, you may need medicine even if your levels are normal.     This information is not intended to replace advice given to you by your health care provider. Make sure you discuss any questions you have with your health care provider.     Document Released: 01/07/2001 Document Revised: 05/05/2014   Document Reviewed: 01/26/2013  Elsevier Interactive Patient Education ©2016 [employer] Inc[employer]  Fat and Cholesterol Restricted Diet  High levels of fat and cholesterol in your blood may lead to various health problems, such as diseases of the heart, blood vessels, gallbladder, liver, and pancreas. Fats are concentrated sources of energy that come in various forms. Certain types of fat, including saturated fat, may be harmful in excess. Cholesterol is a substance needed by your body in small amounts. Your body makes all the cholesterol it needs. Excess cholesterol comes from the food you eat.  When you have high levels of cholesterol and saturated fat in your blood, health problems can develop  because the excess fat and cholesterol will gather along the walls of your blood vessels, causing them to narrow. Choosing the right foods will help you control your intake of fat and cholesterol. This will help keep the levels of these substances in your blood within normal limits and reduce your risk of disease.  WHAT IS MY PLAN?  Your health care provider recommends that you:  · Get no more than __________ % of the total calories in your daily diet from fat.  · Limit your intake of saturated fat to less than ______% of your total calories each day.  · Limit the amount of cholesterol in your diet to less than _________mg per day.  WHAT TYPES OF FAT SHOULD I CHOOSE?  · Choose healthy fats more often. Choose monounsaturated and polyunsaturated fats, such as olive and canola oil, flaxseeds, walnuts, almonds, and seeds.  · Eat more omega-3 fats. Good choices include salmon, mackerel, sardines, tuna, flaxseed oil, and ground flaxseeds. Aim to eat fish at least two times a week.  · Limit saturated fats. Saturated fats are primarily found in animal products, such as meats, butter, and cream. Plant sources of saturated fats include palm oil, palm kernel oil, and coconut oil.  · Avoid foods with partially hydrogenated oils in them. These contain trans fats. Examples of foods that contain trans fats are stick margarine, some tub margarines, cookies, crackers, and other baked goods.  WHAT GENERAL GUIDELINES DO I NEED TO FOLLOW?  These guidelines for healthy eating will help you control your intake of fat and cholesterol:  · Check food labels carefully to identify foods with trans fats or high amounts of saturated fat.  · Fill one half of your plate with vegetables and green salads.  · Fill one fourth of your plate with whole grains. Look for the word "whole" as the first word in the ingredient list.  · Fill one fourth of your plate with lean protein foods.  · Limit fruit to two servings a day. Choose fruit instead of  juice.  · Eat more foods that contain soluble fiber. Examples of foods that contain this type of fiber are apples, broccoli, carrots, beans, peas, and barley. Aim to get 20-30 g of fiber per day.  · Eat more home-cooked food and less restaurant, buffet, and fast food.  · Limit or avoid alcohol.  · Limit foods high in starch and sugar.  · Limit fried foods.  · Cook foods using methods other than frying. Baking, boiling, grilling, and broiling are all great options.  · Lose weight if you are overweight. Losing just 5-10% of your initial body weight can help your overall health and prevent diseases such as diabetes and heart disease.  WHAT FOODS CAN I EAT?  Grains  Whole grains, such as whole wheat or whole grain breads,   crackers, cereals, and pasta. Unsweetened oatmeal, bulgur, barley, quinoa, or brown rice. Corn or whole wheat flour tortillas.  Vegetables  Fresh or frozen vegetables (raw, steamed, roasted, or grilled). Green salads.  Fruits  All fresh, canned (in natural juice), or frozen fruits.  Meat and Other Protein Products  Ground beef (85% or leaner), grass-fed beef, or beef trimmed of fat. Skinless chicken or turkey. Ground chicken or turkey. Pork trimmed of fat. All fish and seafood. Eggs. Dried beans, peas, or lentils. Unsalted nuts or seeds. Unsalted canned or dry beans.  Dairy  Low-fat dairy products, such as skim or 1% milk, 2% or reduced-fat cheeses, low-fat ricotta or cottage cheese, or plain low-fat yogurt.  Fats and Oils  Tub margarines without trans fats. Light or reduced-fat mayonnaise and salad dressings. Avocado. Olive, canola, sesame, or safflower oils. Natural peanut or almond butter (choose ones without added sugar and oil).  The items listed above may not be a complete list of recommended foods or beverages. Contact your dietitian for more options.  WHAT FOODS ARE NOT RECOMMENDED?  Grains  White bread. White pasta. White rice. Cornbread. Bagels, pastries, and croissants. Crackers that contain  trans fat.  Vegetables  White potatoes. Corn. Creamed or fried vegetables. Vegetables in a cheese sauce.  Fruits  Dried fruits. Canned fruit in light or heavy syrup. Fruit juice.  Meat and Other Protein Products  Fatty cuts of meat. Ribs, chicken wings, bacon, sausage, bologna, salami, chitterlings, fatback, hot dogs, bratwurst, and packaged luncheon meats. Liver and organ meats.  Dairy  Whole or 2% milk, cream, half-and-half, and cream cheese. Whole milk cheeses. Whole-fat or sweetened yogurt. Full-fat cheeses. Nondairy creamers and whipped toppings. Processed cheese, cheese spreads, or cheese curds.  Sweets and Desserts  Corn syrup, sugars, honey, and molasses. Candy. Jam and jelly. Syrup. Sweetened cereals. Cookies, pies, cakes, donuts, muffins, and ice cream.  Fats and Oils  Butter, stick margarine, lard, shortening, ghee, or bacon fat. Coconut, palm kernel, or palm oils.  Beverages  Alcohol. Sweetened drinks (such as sodas, lemonade, and fruit drinks or punches).  The items listed above may not be a complete list of foods and beverages to avoid. Contact your dietitian for more information.     This information is not intended to replace advice given to you by your health care provider. Make sure you discuss any questions you have with your health care provider.     Document Released: 04/14/2005 Document Revised: 05/05/2014 Document Reviewed: 07/13/2013  Elsevier Interactive Patient Education ©2016 [employer] Inc[employer]

## 2015-10-09 ENCOUNTER — Other Ambulatory Visit: Payer: Self-pay

## 2015-10-09 DIAGNOSIS — I10 Essential (primary) hypertension: Secondary | ICD-10-CM

## 2015-10-09 MED ORDER — AMLODIPINE BESYLATE 5 MG PO TABS: 5.0000 mg | ORAL_TABLET | Freq: Every day | ORAL | Status: DC

## 2015-10-09 NOTE — Telephone Encounter (Signed)
Refill for amlodipine sent into pharmacy. Thanks!  

## 2015-10-16 ENCOUNTER — Other Ambulatory Visit (HOSPITAL_COMMUNITY)
Admission: RE | Admit: 2015-10-16 | Discharge: 2015-10-16 | Disposition: A | Discharge status: Home / Self Care | Payer: Medicare Other | Source: Ambulatory Visit | Attending: Internal Medicine | Admitting: Internal Medicine

## 2015-10-16 ENCOUNTER — Ambulatory Visit (INDEPENDENT_AMBULATORY_CARE_PROVIDER_SITE_OTHER): Payer: Medicare Other | Admitting: Internal Medicine

## 2015-10-16 ENCOUNTER — Ambulatory Visit: Payer: Self-pay | Admitting: *Deleted

## 2015-10-16 ENCOUNTER — Encounter: Payer: Self-pay | Admitting: Internal Medicine

## 2015-10-16 VITALS — BP 125/85 | HR 80 | Temp 98.5°F | Ht 64.0 in | Wt 168.0 lb

## 2015-10-16 DIAGNOSIS — Z23 Encounter for immunization: Secondary | ICD-10-CM

## 2015-10-16 DIAGNOSIS — B20 Human immunodeficiency virus [HIV] disease: Secondary | ICD-10-CM | POA: Diagnosis not present

## 2015-10-16 DIAGNOSIS — Z113 Encounter for screening for infections with a predominantly sexual mode of transmission: Secondary | ICD-10-CM

## 2015-10-16 DIAGNOSIS — G4700 Insomnia, unspecified: Secondary | ICD-10-CM

## 2015-10-16 DIAGNOSIS — F4323 Adjustment disorder with mixed anxiety and depressed mood: Secondary | ICD-10-CM

## 2015-10-16 NOTE — BH Specialist Note (Signed)
Counselor met with Jacqueline Lowe in the exam room today for a warm handoff.  Patient was oriented times four with flat affect.  Patient was soft spoken and was sluggish.  Patient  Shared that she was not feeling good because she had not been sleeping well lately.  Patient stated that she use to be on prescribed sleep medication but went off as she thought she could sleep without it.  Patient indicated that her insomnia has returned.  Counselor provided support and encouragement.  Counselor used reflective listening skills while engaging with patient.  Counselor explained counseling services to patient and recommended she make an appointment anytime she had a need.  Patient stated that she would.   Rolena Infante, MA, LPC [facility]

## 2015-10-16 NOTE — Assessment & Plan Note (Signed)
Labs today and rtc 4 months unless concerns.

## 2015-10-16 NOTE — Assessment & Plan Note (Addendum)
She was provided some information on sleep hygiene.  Will consider sleep referral to Dr. Isidoro Donning if it persists.  Seen by our counselor today as well, ? Depression.

## 2015-10-16 NOTE — Progress Notes (Signed)
   Subjective:    Patient ID: Shatari Moorman, female    DOB: 06/20/1975, 40 y.o.   MRN: [id number]  HPI Here for follow up of HIV.   On Genvoya and denies any missed doses.  She is struggling with insomnia.  She states it has been a problem for years and used to do ok with medication but is not taking now.  Doesn't fall asleep well and has frequent waking.  Some depression associated with it.      Review of Systems  Constitutional: Negative for fatigue.  Gastrointestinal: Negative for nausea and diarrhea.  Skin: Negative for rash.  Neurological: Negative for dizziness and light-headedness.       Objective:   Physical Exam  Constitutional: She appears well-developed and well-nourished.  Eyes: No scleral icterus.  Cardiovascular: Normal rate, regular rhythm and normal heart sounds.   No murmur heard. Pulmonary/Chest: Effort normal and breath sounds normal. No respiratory distress.  Skin: No rash noted.          Assessment & Plan:

## 2015-10-17 LAB — T-HELPER CELL (CD4) - (RCID CLINIC ONLY)
CD4 % Helper T Cell: 38 % (ref 33–55)
CD4 T CELL ABS: 750 /uL (ref 400–2700)

## 2015-10-17 LAB — HIV-1 RNA QUANT-NO REFLEX-BLD

## 2015-10-17 LAB — URINE CYTOLOGY ANCILLARY ONLY
CHLAMYDIA, DNA PROBE: NEGATIVE
Neisseria Gonorrhea: NEGATIVE

## 2015-10-17 LAB — RPR

## 2015-10-25 ENCOUNTER — Encounter: Payer: Self-pay | Admitting: Certified Nurse Midwife

## 2015-10-25 ENCOUNTER — Encounter: Payer: Self-pay | Admitting: Gastroenterology

## 2015-10-25 ENCOUNTER — Ambulatory Visit (INDEPENDENT_AMBULATORY_CARE_PROVIDER_SITE_OTHER): Payer: Medicare Other | Admitting: Certified Nurse Midwife

## 2015-10-25 VITALS — BP 153/90 | HR 108 | Temp 99.6°F | Wt 162.0 lb

## 2015-10-25 DIAGNOSIS — Z01419 Encounter for gynecological examination (general) (routine) without abnormal findings: Secondary | ICD-10-CM | POA: Diagnosis not present

## 2015-10-25 DIAGNOSIS — Z8541 Personal history of malignant neoplasm of cervix uteri: Secondary | ICD-10-CM

## 2015-10-25 DIAGNOSIS — R1030 Lower abdominal pain, unspecified: Secondary | ICD-10-CM | POA: Diagnosis not present

## 2015-10-25 DIAGNOSIS — R109 Unspecified abdominal pain: Secondary | ICD-10-CM | POA: Insufficient documentation

## 2015-10-25 DIAGNOSIS — R3915 Urgency of urination: Secondary | ICD-10-CM

## 2015-10-25 DIAGNOSIS — R350 Frequency of micturition: Secondary | ICD-10-CM

## 2015-10-25 LAB — POCT URINALYSIS DIPSTICK
Bilirubin, UA: NEGATIVE
Blood, UA: NEGATIVE
GLUCOSE UA: NEGATIVE
Ketones, UA: NEGATIVE
LEUKOCYTES UA: NEGATIVE
NITRITE UA: NEGATIVE
Protein, UA: NEGATIVE
SPEC GRAV UA: 1.02
UROBILINOGEN UA: NEGATIVE
pH, UA: 5

## 2015-10-25 MED ORDER — AMLODIPINE BESYLATE 10 MG PO TABS: 10.0000 mg | ORAL_TABLET | Freq: Every day | ORAL | Status: DC

## 2015-10-25 NOTE — Addendum Note (Signed)
Addended by: Clarnce Flock E on: 10/25/2015 12:34 PM   Modules accepted: Orders

## 2015-10-25 NOTE — Progress Notes (Signed)
Patient ID: Jacqueline Lowe, female   DOB: 24-Jan-1976, 40 y.o.   MRN: [id number]    Subjective:      Jacqueline Lowe is a 40 y.o. female here for a routine exam.  Current complaints:  Severe cramping in lower abdomen, bilaterally.  Has taken tylenol for the pain.  Cannot take ibuprofen.  Has been going on for several months, constant cramping.  Describes the pain as a 10/10, feels like a stabbing pain with pressure.  Is having regular bowel movements, soft. No diarrhea, nausea or vomiting.   Had a hysterectomy in 2013, dysmenorrhea and hx of cervical cancer in 2005 with LEEP.   Reports frequent urination even at night.    Personal health questionnaire:  Is patient Ashkenazi Jewish, have a family history of breast and/or ovarian cancer: yes Is there a family history of uterine cancer diagnosed at age < 71, gastrointestinal cancer, urinary tract cancer, family member who is a Field seismologist syndrome-associated carrier: aunt: liver Is the patient overweight and hypertensive, family history of diabetes, personal history of gestational diabetes, preeclampsia or PCOS: yes Is patient over 11, have PCOS,  family history of premature CHD under age 70, diabetes, smoke, have hypertension or peripheral artery disease:  yes At any time, has a partner hit, kicked or otherwise hurt or frightened you?: no Over the past 2 weeks, have you felt down, depressed or hopeless?: no Over the past 2 weeks, have you felt little interest or pleasure in doing things?:no   Gynecologic History Patient's last menstrual period was 10/02/2011. Contraception: status post hysterectomy Last Pap: unknown. Results were: abnormal Last mammogram: 2015. Results were: normal  Obstetric History OB History  No data available    Past Medical History  Diagnosis Date  . Cervical cancer (HCC)     dx at age 58  . HIV (human immunodeficiency virus infection) ([place])     reported to be HIV+ by patient    Past Surgical History  Procedure  Laterality Date  . Abdominal hysterectomy      excessive bleeding; ovaries remain intact     Current outpatient prescriptions:  .  elvitegravir-cobicistat-emtricitabine-tenofovir (GENVOYA) 150-150-200-10 MG TABS tablet, Take 1 tablet by mouth daily., Disp: 30 tablet, Rfl: 11 .  fenofibrate (TRICOR) 145 MG tablet, Take 1 tablet (145 mg total) by mouth daily., Disp: 30 tablet, Rfl: 5 .  amLODipine (NORVASC) 10 MG tablet, Take 1 tablet (10 mg total) by mouth daily., Disp: 30 tablet, Rfl: 3 No Known Allergies  Social History  Substance Use Topics  . Smoking status: Former Research scientist (life sciences)  . Smokeless tobacco: Never Used  . Alcohol Use: 0.0 oz/week    0 Standard drinks or equivalent per week     Comment: socially    Family History  Problem Relation Age of Onset  . Other Father     does not know father or pat history  . Cancer Maternal Aunt 40    breast cancer  . Cancer Maternal Aunt 40    breast cancer      Review of Systems  Constitutional: negative for fatigue and weight loss Respiratory: negative for cough and wheezing Cardiovascular: negative for chest pain, fatigue and palpitations Gastrointestinal: negative for abdominal pain and change in bowel habits Musculoskeletal:negative for myalgias Neurological: negative for gait problems and tremors Behavioral/Psych: negative for abusive relationship, depression Endocrine: negative for temperature intolerance   Genitourinary:negative for abnormal menstrual periods, genital lesions, hot flashes, sexual problems and vaginal discharge Integument/breast: negative for breast lump, breast tenderness, nipple  discharge and skin lesion(s)    Objective:       BP 153/90 mmHg  Pulse 108  Temp(Src) 99.6 F (37.6 C)  Wt 162 lb (73.483 kg)  LMP 10/02/2011 General:   alert  Skin:   no rash or abnormalities  Lungs:   clear to auscultation bilaterally  Heart:   regular rate and rhythm, S1, S2 normal, no murmur, click, rub or gallop  Breasts:    normal without suspicious masses, skin or nipple changes or axillary nodes  Abdomen:  normal findings: no organomegaly, soft, non-tender and no hernia  Pelvis:  External genitalia: normal general appearance Urinary system: urethral meatus normal and bladder without fullness, nontender Vaginal: normal without tenderness, induration or masses Cervix: surgically absent Adnexa: normal bimanual exam Uterus: surgically absent   Lab Review Urine pregnancy test Labs reviewed yes Radiologic studies reviewed no  50% of 30 min visit spent on counseling and coordination of care.   Assessment:    Healthy female exam.   HIV +  Abdominal pain in female of unknown origin  Frequent urgency with urination  H/O cervical cancer  Plan:    Education reviewed: calcium supplements, depression evaluation, low fat, low cholesterol diet, safe sex/STD prevention, self breast exams, skin cancer screening and weight bearing exercise. Contraception: post menopausal status. Follow up in: 1 year.   Meds ordered this encounter  Medications  . amLODipine (NORVASC) 10 MG tablet    Sig: Take 1 tablet (10 mg total) by mouth daily.    Dispense:  30 tablet    Refill:  3   No orders of the defined types were placed in this encounter.   Need to obtain previous records Possible management options include: abdominal US/CT

## 2015-10-26 LAB — CMP14+CBC/D/PLT+TSH
ALK PHOS: 72 IU/L (ref 39–117)
ALT: 13 IU/L (ref 0–32)
AST: 17 IU/L (ref 0–40)
Albumin/Globulin Ratio: 1.3 (ref 1.2–2.2)
Albumin: 4.7 g/dL (ref 3.5–5.5)
BILIRUBIN TOTAL: 0.3 mg/dL (ref 0.0–1.2)
BUN / CREAT RATIO: 11 (ref 9–23)
BUN: 10 mg/dL (ref 6–20)
Basophils Absolute: 0 10*3/uL (ref 0.0–0.2)
Basos: 1 %
CHLORIDE: 103 mmol/L (ref 96–106)
CO2: 19 mmol/L (ref 18–29)
CREATININE: 0.91 mg/dL (ref 0.57–1.00)
Calcium: 10.8 mg/dL — ABNORMAL HIGH (ref 8.7–10.2)
EOS (ABSOLUTE): 0.1 10*3/uL (ref 0.0–0.4)
EOS: 1 %
GFR calc Af Amer: 93 mL/min/{1.73_m2} (ref >59)
GFR, EST NON AFRICAN AMERICAN: 80 mL/min/{1.73_m2} (ref >59)
GLUCOSE: 89 mg/dL (ref 65–99)
Globulin, Total: 3.7 g/dL (ref 1.5–4.5)
HEMOGLOBIN: 13.8 g/dL (ref 11.1–15.9)
Hematocrit: 40 % (ref 34.0–46.6)
Immature Grans (Abs): 0 10*3/uL (ref 0.0–0.1)
Immature Granulocytes: 0 %
LYMPHS ABS: 2.3 10*3/uL (ref 0.7–3.1)
Lymphs: 37 %
MCH: 30.1 pg (ref 26.6–33.0)
MCHC: 34.5 g/dL (ref 31.5–35.7)
MCV: 87 fL (ref 79–97)
MONOS ABS: 0.7 10*3/uL (ref 0.1–0.9)
Monocytes: 11 %
NEUTROS PCT: 50 %
Neutrophils Absolute: 3.2 10*3/uL (ref 1.4–7.0)
Platelets: 469 10*3/uL — ABNORMAL HIGH (ref 150–379)
Potassium: 4.6 mmol/L (ref 3.5–5.2)
RBC: 4.58 x10E6/uL (ref 3.77–5.28)
RDW: 14 % (ref 12.3–15.4)
SODIUM: 141 mmol/L (ref 134–144)
TSH: 1.35 u[IU]/mL (ref 0.450–4.500)
Total Protein: 8.4 g/dL (ref 6.0–8.5)
WBC: 6.3 10*3/uL (ref 3.4–10.8)

## 2015-10-26 LAB — HEPATIC FUNCTION PANEL: BILIRUBIN, DIRECT: 0.1 mg/dL (ref 0.00–0.40)

## 2015-10-29 LAB — NUSWAB VG, CANDIDA 6SP
ATOPOBIUM VAGINAE: HIGH {score} — AB
BVAB 2: HIGH Score — AB
CANDIDA ALBICANS, NAA: NEGATIVE
Candida glabrata, NAA: NEGATIVE
Candida krusei, NAA: NEGATIVE
Candida lusitaniae, NAA: NEGATIVE
Candida parapsilosis, NAA: NEGATIVE
Candida tropicalis, NAA: NEGATIVE
MEGASPHAERA 1: HIGH {score} — AB
TRICH VAG BY NAA: NEGATIVE

## 2015-10-30 LAB — PAP IG AND HPV HIGH-RISK
HPV, HIGH-RISK: NEGATIVE
PAP SMEAR COMMENT: 0

## 2015-10-31 ENCOUNTER — Other Ambulatory Visit (HOSPITAL_COMMUNITY): Payer: Self-pay | Admitting: Certified Nurse Midwife

## 2015-10-31 DIAGNOSIS — B9689 Other specified bacterial agents as the cause of diseases classified elsewhere: Secondary | ICD-10-CM

## 2015-10-31 DIAGNOSIS — N760 Acute vaginitis: Principal | ICD-10-CM

## 2015-10-31 MED ORDER — METRONIDAZOLE 500 MG PO TABS: 500.0000 mg | ORAL_TABLET | Freq: Two times a day (BID) | ORAL | Status: DC

## 2015-12-04 ENCOUNTER — Encounter: Payer: Self-pay | Admitting: Emergency Medicine

## 2015-12-05 ENCOUNTER — Ambulatory Visit (INDEPENDENT_AMBULATORY_CARE_PROVIDER_SITE_OTHER): Payer: Medicare Other | Admitting: Urology

## 2015-12-05 DIAGNOSIS — N3281 Overactive bladder: Secondary | ICD-10-CM | POA: Diagnosis not present

## 2015-12-05 DIAGNOSIS — R350 Frequency of micturition: Secondary | ICD-10-CM | POA: Diagnosis not present

## 2015-12-05 DIAGNOSIS — R3915 Urgency of urination: Secondary | ICD-10-CM

## 2015-12-10 DIAGNOSIS — R3915 Urgency of urination: Secondary | ICD-10-CM | POA: Diagnosis not present

## 2015-12-10 DIAGNOSIS — R350 Frequency of micturition: Secondary | ICD-10-CM | POA: Diagnosis not present

## 2015-12-27 ENCOUNTER — Encounter: Payer: Self-pay | Admitting: Family Medicine

## 2015-12-27 ENCOUNTER — Ambulatory Visit (INDEPENDENT_AMBULATORY_CARE_PROVIDER_SITE_OTHER): Payer: Medicare Other | Admitting: Family Medicine

## 2015-12-27 VITALS — BP 121/80 | HR 93 | Temp 98.5°F | Ht 64.0 in | Wt 166.0 lb

## 2015-12-27 DIAGNOSIS — E785 Hyperlipidemia, unspecified: Secondary | ICD-10-CM

## 2015-12-27 DIAGNOSIS — I10 Essential (primary) hypertension: Secondary | ICD-10-CM | POA: Diagnosis not present

## 2015-12-27 MED ORDER — TRAZODONE HCL 50 MG PO TABS: ORAL_TABLET | ORAL | 3 refills | Status: DC

## 2015-12-27 MED ORDER — FENOFIBRATE 145 MG PO TABS: 145.0000 mg | ORAL_TABLET | Freq: Every day | ORAL | 1 refills | Status: DC

## 2015-12-27 MED ORDER — AMLODIPINE BESYLATE 10 MG PO TABS: 10.0000 mg | ORAL_TABLET | Freq: Every day | ORAL | 1 refills | Status: DC

## 2015-12-27 NOTE — Patient Instructions (Signed)
Let me know if Trazadone stops working for sleep. Try to sit down and elevate your feet and legs a couple of times a day Follow-up if knees get worse.

## 2015-12-27 NOTE — Progress Notes (Signed)
Jacqueline Lowe, is a 40 y.o. female  [id number]  [id number]  DOB - 1975-11-11  CC:  Chief Complaint  Patient presents with  . Follow-up    insomnia chronic but it seems worse recently, and hot feeling in knees and swelling in lower legs       HPI: Jacqueline Lowe is a 40 y.o. female here for follow-up chronic conditions, specifically hypertension and hyperlipidemia. She is on amlodipine and Tricor and needs refills. Her complaints today are of insomnia and her knees feeling funny. She says her knees do not hurt, they feel feverish and swollen. This has been going for for a couple of months. She has used [place] for sleep in the past when she was in [place]. She has HIV and is followed by [facility].   Health maintenance: She declines Tdap and influenza. Her A1C was 5.7 in November. Will recheck in a couple of months. She reports no tobacco or drugs and only occ alcohol. She reports eating a low fried food diet and walks daily.  No Known Allergies Past Medical History:  Diagnosis Date  . Cervical cancer (HCC)    dx at age 23  . HIV (human immunodeficiency virus infection) ([place])    reported to be HIV+ by patient   Current Outpatient Prescriptions on File Prior to Visit  Medication Sig Dispense Refill  . elvitegravir-cobicistat-emtricitabine-tenofovir (GENVOYA) 150-150-200-10 MG TABS tablet Take 1 tablet by mouth daily. 30 tablet 11  . metroNIDAZOLE (FLAGYL) 500 MG tablet Take 1 tablet (500 mg total) by mouth 2 (two) times daily. (Patient not taking: Reported on 12/27/2015) 14 tablet 0   No current facility-administered medications on file prior to visit.    Family History  Problem Relation Age of Onset  . Other Father     does not know father or pat history  . Cancer Maternal Aunt 40    breast cancer  . Cancer Maternal Aunt 40    breast cancer   Social History   Social History  . Marital status: Single    Spouse name: N/A  . Number of children: N/A  . Years of  education: N/A   Occupational History  . Not on file.   Social History Main Topics  . Smoking status: Former Research scientist (life sciences)  . Smokeless tobacco: Never Used  . Alcohol use 0.0 oz/week     Comment: socially  . Drug use: No  . Sexual activity: Yes     Comment: declined condoms   Other Topics Concern  . Not on file   Social History Narrative  . No narrative on file    Review of Systems: Constitutional: Negative for fever, chills, appetite change, weight loss. Positive for fatigue and insomnia Skin: Negative for rashes or lesions of concern. HENT: Negative for ear pain, ear discharge.nose bleeds Eyes: Negative for pain, discharge, redness, itching and visual disturbance. Neck: Negative for pain, stiffness Respiratory: Negative for cough, shortness of breath,   Cardiovascular: Negative for chest pain, palpitations. Some swelling of feet and lower legs with walking a while Gastrointestinal: Negative for nausea, vomiting, diarrhea, constipations. Positive for abd pain and is seeing GI Genitourinary: Negative for dysuria, urgency,  Hematuria. Positive for frequency and is seeing urology  Musculoskeletal: Negative for back pain, joint pain, joint  swelling, and gait problem.Negative for weakness.Knees feel hot and swollen Neurological: Negative for dizziness, tremors, seizures, syncope,   light-headedness, numbness and headaches.  Hematological: Negative for easy bruising or bleeding Psychiatric/Behavioral: Negative for depression, anxiety, decreased concentration, confusion  Objective:   Vitals:   12/27/15 0847  BP: 121/80  Pulse: 93  Temp: 98.5 F (36.9 C)    Physical Exam: Constitutional: Patient appears well-developed and well-nourished. No distress. HENT: Normocephalic, atraumatic, External right and left ear normal. Oropharynx is clear and moist.  Eyes: Conjunctivae and EOM are normal. PERRLA, no scleral icterus. Neck: Normal ROM. Neck supple. No lymphadenopathy, No  thyromegaly. CVS: RRR, S1/S2 +, no murmurs, no gallops, no rubs Pulmonary: Effort and breath sounds normal, no stridor, rhonchi, wheezes, rales.  Abdominal: Soft. Normoactive BS,, no distension, tenderness, rebound or guarding.  Musculoskeletal: Normal range of motion. No edema and no tenderness. Knees have a normal appearance, no pain with ROM Neuro: Alert.Normal muscle tone coordination. Non-focal Skin: Skin is warm and dry. No rash noted. Not diaphoretic. No erythema. No pallor. Psychiatric: . Behavior, judgment, thought content normal.Flat affect  Lab Results  Component Value Date   WBC 6.3 10/25/2015   HGB 13.8 06/26/2015   HCT 40.0 10/25/2015   MCV 87 10/25/2015   PLT 469 (H) 10/25/2015   Lab Results  Component Value Date   CREATININE 0.91 10/25/2015   BUN 10 10/25/2015   NA 141 10/25/2015   K 4.6 10/25/2015   CL 103 10/25/2015   CO2 19 10/25/2015    Lab Results  Component Value Date   HGBA1C 5.7 (H) 03/15/2015   Lipid Panel     Component Value Date/Time   CHOL 268 (H) 09/19/2015 0839   TRIG 150 (H) 09/19/2015 0839   HDL 30 (L) 09/19/2015 0839   CHOLHDL 8.9 (H) 09/19/2015 0839   VLDL 30 09/19/2015 0839   LDLCALC 208 (H) 09/19/2015 0839       Assessment and plan:   1. Essential hypertension -continue amlodipine 10 mg daily  2. Hyperlipidemia LDL goal <100  - fenofibrate (TRICOR) 145 MG tablet; Take 1 tablet (145 mg total) by mouth daily.  Dispense: 90 tablet; Refill:   3. Insomnia -Trial of Trazadone -Will save ambien for later if needed.   Return in about 6 months (around 06/25/2016).  The patient was given clear instructions to go to ER or return to medical center if symptoms don't improve, worsen or new problems develop. The patient verbalized understanding.    Micheline Chapman FNP  12/27/2015, 2:32 PM

## 2015-12-28 ENCOUNTER — Ambulatory Visit (INDEPENDENT_AMBULATORY_CARE_PROVIDER_SITE_OTHER): Payer: Medicare Other | Admitting: Gastroenterology

## 2015-12-28 ENCOUNTER — Encounter: Payer: Self-pay | Admitting: Gastroenterology

## 2015-12-28 VITALS — BP 116/60 | HR 104 | Ht 62.5 in | Wt 168.2 lb

## 2015-12-28 DIAGNOSIS — R109 Unspecified abdominal pain: Secondary | ICD-10-CM | POA: Diagnosis not present

## 2015-12-28 DIAGNOSIS — K5900 Constipation, unspecified: Secondary | ICD-10-CM | POA: Diagnosis not present

## 2015-12-28 MED ORDER — DICYCLOMINE HCL 10 MG PO CAPS: 10.0000 mg | ORAL_CAPSULE | Freq: Three times a day (TID) | ORAL | 3 refills | Status: DC

## 2015-12-28 MED ORDER — POLYETHYLENE GLYCOL 3350 17 GM/SCOOP PO POWD: 34.0000 g | Freq: Two times a day (BID) | ORAL | 3 refills | Status: DC

## 2015-12-28 NOTE — Patient Instructions (Addendum)
We have sent Mirlalax and Bentyl to your pharmacy   Call back in 3 weeks to let us know how you're doing

## 2015-12-28 NOTE — Progress Notes (Signed)
HPI :  40 y/o female with history of HIV, CD4 count in the 700s, and cervical cancer with LEEP / hysterectomy, as well as severe menorrhagia with history of anemia, presenting with abdominal pain and constipation, new patient to our office.   She reports abdominal pains that have been bothering her. She endorses some cramping in her mid abdomen and LLQ. She thinks the LLQ pain has been ongoing for "years". She has had an MRI done for that pain a few years ago, she was told she had an ovarian cyst at the time. She thinks the mid abdominal cramps have been ongoing for a few months. The mid abdominal pains come and go, bother her intermittently. Pain is not worse with eating. Having a bowel movement can help relieve the cramp. Her LLQ pain is present mostly all the time and is tender to the touch.  She strains with a bowel movement and endorses hemorrhoids. She has roughly one BM per day, rarely twice per week. She has been on Miralax in the past which she thinks helped, took it twice a day, but stopped it a long time ago. She has had constipation longstanding. She has chronic bloating which bothers her as well. Having a bowel movement can improve her bloating and her abdominal pains. She has not had any blood in the stools. No vomiting. Eating well without complaints. She denies any weight loss unexpectedly, she has been trying to lose weight.   No FH of colon cancer. No prior colonoscopy.   She reported she had a history of menorrhagia associated with severe iron deficiency, leading to blood transfusions and IV iron, eventually had hysterectomy in 2013. No anemia at this time.     Past Medical History:  Diagnosis Date  . Anemia   . Cervical cancer (HCC)    dx at age 36  . HIV (human immunodeficiency virus infection) ([place])    reported to be HIV+ by patient  . HLD (hyperlipidemia)   . HTN (hypertension)      Past Surgical History:  Procedure Laterality Date  . ABDOMINAL HYSTERECTOMY     excessive bleeding; ovaries remain intact  . LEEP     Family History  Problem Relation Age of Onset  . Cervical cancer Mother   . Diabetes Mother   . Heart disease Mother   . Other Father     does not know father or pat history  . Breast cancer Maternal Aunt 40    x 2  . Colon polyps Maternal Aunt   . Diabetes Maternal Aunt     x 2  . Heart disease Maternal Aunt   . Kidney cancer Maternal Aunt    Social History  Substance Use Topics  . Smoking status: Former Smoker    Quit date: 04/28/1997  . Smokeless tobacco: Never Used  . Alcohol use 0.0 oz/week     Comment: socially   Current Outpatient Prescriptions  Medication Sig Dispense Refill  . amLODipine (NORVASC) 10 MG tablet Take 1 tablet (10 mg total) by mouth daily. 90 tablet 1  . elvitegravir-cobicistat-emtricitabine-tenofovir (GENVOYA) 150-150-200-10 MG TABS tablet Take 1 tablet by mouth daily. 30 tablet 11  . fenofibrate (TRICOR) 145 MG tablet Take 1 tablet (145 mg total) by mouth daily. 90 tablet 1  . traZODone (DESYREL) 50 MG tablet Take one po at bedtime. (Patient not taking: Reported on 12/28/2015) 30 tablet 3   No current facility-administered medications for this visit.    No Known Allergies  Review of Systems: All systems reviewed and negative except where noted in HPI.    Lab Results  Component Value Date   WBC 6.3 10/25/2015   HGB 13.8 06/26/2015   HCT 40.0 10/25/2015   MCV 87 10/25/2015   PLT 469 (H) 10/25/2015    Lab Results  Component Value Date   CREATININE 0.91 10/25/2015   BUN 10 10/25/2015   NA 141 10/25/2015   K 4.6 10/25/2015   CL 103 10/25/2015   CO2 19 10/25/2015    Lab Results  Component Value Date   ALT 13 10/25/2015   AST 17 10/25/2015   ALKPHOS 72 10/25/2015   BILITOT 0.3 10/25/2015     Physical Exam: BP 116/60 (BP Location: Left Arm, Patient Position: Sitting, Cuff Size: Normal)   Pulse (!) 104   Ht 5' 2.5" (1.588 m) Comment: height measured without shoes  Wt 168 lb 4  oz (76.3 kg)   LMP 10/02/2011   BMI 30.28 kg/m  Constitutional: Pleasant,well-developed, female in no acute distress. HEENT: Normocephalic and atraumatic. Conjunctivae are normal. No scleral icterus. Neck supple.  Cardiovascular: Normal rate, regular rhythm.  Pulmonary/chest: Effort normal and breath sounds normal. No wheezing, rales or rhonchi. Abdominal: Soft, nondistended,mild LLQ TTP without rebound or guarding, negative carnett sign. Bowel sounds active throughout. There are no masses palpable. No hepatomegaly. Extremities: no edema Lymphadenopathy: No cervical adenopathy noted. Neurological: Alert and oriented to person place and time. Skin: Skin is warm and dry. No rashes noted. Psychiatric: Normal mood and affect. Behavior is normal.   ASSESSMENT AND PLAN: 40 y/o female with well-controlled HIV, history of cervical cancer, presenting with chronic constipation and abdominal pain. I suspect she may have IBS-C. We will treat her constipation and see if this relieves her abdominal discomfort. Will start with Miralax double dose, once to twice daily, and titrate as needed, given this has worked for her in the past. We will also try bentyl to use PRN for spasm. I asked her to try this for a few weeks and see if she has any benefit, and contact us for reassessment if not. If her bowels normalize but her pain persists, she should contact us and we may consider abdominal imaging. She can otherwise f/u PRN if her symptoms improve.   Orient Cellar, MD [facility] Pager [phone]  CC: Morene Crocker, CNM

## 2016-01-02 ENCOUNTER — Ambulatory Visit (INDEPENDENT_AMBULATORY_CARE_PROVIDER_SITE_OTHER): Payer: Medicare Other | Admitting: Urology

## 2016-01-02 DIAGNOSIS — R3915 Urgency of urination: Secondary | ICD-10-CM

## 2016-01-02 DIAGNOSIS — R350 Frequency of micturition: Secondary | ICD-10-CM

## 2016-01-09 DIAGNOSIS — G603 Idiopathic progressive neuropathy: Secondary | ICD-10-CM | POA: Diagnosis not present

## 2016-01-09 DIAGNOSIS — R3981 Functional urinary incontinence: Secondary | ICD-10-CM | POA: Diagnosis not present

## 2016-01-09 DIAGNOSIS — B20 Human immunodeficiency virus [HIV] disease: Secondary | ICD-10-CM | POA: Diagnosis not present

## 2016-01-09 DIAGNOSIS — R5383 Other fatigue: Secondary | ICD-10-CM | POA: Diagnosis not present

## 2016-01-09 DIAGNOSIS — M818 Other osteoporosis without current pathological fracture: Secondary | ICD-10-CM | POA: Diagnosis not present

## 2016-01-09 DIAGNOSIS — E538 Deficiency of other specified B group vitamins: Secondary | ICD-10-CM | POA: Diagnosis not present

## 2016-01-09 DIAGNOSIS — E559 Vitamin D deficiency, unspecified: Secondary | ICD-10-CM | POA: Diagnosis not present

## 2016-01-09 DIAGNOSIS — F5101 Primary insomnia: Secondary | ICD-10-CM | POA: Diagnosis not present

## 2016-01-09 DIAGNOSIS — Z79899 Other long term (current) drug therapy: Secondary | ICD-10-CM | POA: Diagnosis not present

## 2016-01-09 DIAGNOSIS — I10 Essential (primary) hypertension: Secondary | ICD-10-CM | POA: Diagnosis not present

## 2016-01-09 DIAGNOSIS — K5900 Constipation, unspecified: Secondary | ICD-10-CM | POA: Diagnosis not present

## 2016-01-17 DIAGNOSIS — M5416 Radiculopathy, lumbar region: Secondary | ICD-10-CM | POA: Diagnosis not present

## 2016-01-17 DIAGNOSIS — G603 Idiopathic progressive neuropathy: Secondary | ICD-10-CM | POA: Diagnosis not present

## 2016-02-18 ENCOUNTER — Other Ambulatory Visit: Payer: Medicare Other

## 2016-02-18 ENCOUNTER — Ambulatory Visit (INDEPENDENT_AMBULATORY_CARE_PROVIDER_SITE_OTHER): Payer: Medicare Other | Admitting: Internal Medicine

## 2016-02-18 VITALS — BP 122/86 | HR 102 | Temp 98.7°F | Ht 62.0 in | Wt 170.0 lb

## 2016-02-18 DIAGNOSIS — B20 Human immunodeficiency virus [HIV] disease: Secondary | ICD-10-CM | POA: Diagnosis not present

## 2016-02-18 DIAGNOSIS — Z79899 Other long term (current) drug therapy: Secondary | ICD-10-CM

## 2016-02-18 DIAGNOSIS — Z21 Asymptomatic human immunodeficiency virus [HIV] infection status: Secondary | ICD-10-CM

## 2016-02-18 DIAGNOSIS — F5101 Primary insomnia: Secondary | ICD-10-CM | POA: Diagnosis not present

## 2016-02-18 DIAGNOSIS — Z23 Encounter for immunization: Secondary | ICD-10-CM | POA: Diagnosis not present

## 2016-02-18 NOTE — Assessment & Plan Note (Signed)
Discussed again and is working with her psychiatrist.

## 2016-02-18 NOTE — Assessment & Plan Note (Signed)
Gets lipid panel from PCP.

## 2016-02-18 NOTE — Assessment & Plan Note (Addendum)
Doing well.  Labs today and rtc 6 months.   Sees obgyn

## 2016-02-18 NOTE — Progress Notes (Signed)
   Subjective:    Patient ID: Jacqueline Lowe, female    DOB: 1975/07/29, 40 y.o.   MRN: [id number]  HPI Here for follow up of HIV.   On Genvoya and denies any missed doses. Still struggles with sleeping, on Ambien per her psychiatrist.  Trazodone or restoril did not help much.  Sees urology for bladder issues and going to neurology for the same.  No weight loss, no diarrhea. Continues to refuse all vaccines.     Review of Systems  Constitutional: Negative for fatigue.  Gastrointestinal: Negative for diarrhea and nausea.  Skin: Negative for rash.  Neurological: Negative for dizziness and light-headedness.       Objective:   Physical Exam  Constitutional: She appears well-developed and well-nourished.  Eyes: No scleral icterus.  Cardiovascular: Normal rate, regular rhythm and normal heart sounds.   No murmur heard. Pulmonary/Chest: Effort normal and breath sounds normal. No respiratory distress.  Skin: No rash noted.          Assessment & Plan:

## 2016-02-19 LAB — T-HELPER CELL (CD4) - (RCID CLINIC ONLY)
CD4 T CELL HELPER: 34 % (ref 33–55)
CD4 T Cell Abs: 730 /uL (ref 400–2700)

## 2016-02-20 ENCOUNTER — Ambulatory Visit (INDEPENDENT_AMBULATORY_CARE_PROVIDER_SITE_OTHER): Payer: Medicare Other | Admitting: Urology

## 2016-02-20 DIAGNOSIS — N312 Flaccid neuropathic bladder, not elsewhere classified: Secondary | ICD-10-CM | POA: Diagnosis not present

## 2016-02-20 DIAGNOSIS — R338 Other retention of urine: Secondary | ICD-10-CM | POA: Diagnosis not present

## 2016-02-20 DIAGNOSIS — R3915 Urgency of urination: Secondary | ICD-10-CM

## 2016-02-20 LAB — HIV-1 RNA QUANT-NO REFLEX-BLD
HIV 1 RNA Quant: <20 copies/mL (ref <20)
HIV-1 RNA Quant, Log: <1.3 Log copies/mL (ref <1.30)

## 2016-03-10 ENCOUNTER — Other Ambulatory Visit: Payer: Self-pay | Admitting: Neurology

## 2016-03-10 DIAGNOSIS — E559 Vitamin D deficiency, unspecified: Secondary | ICD-10-CM | POA: Diagnosis not present

## 2016-03-10 DIAGNOSIS — F5101 Primary insomnia: Secondary | ICD-10-CM | POA: Diagnosis not present

## 2016-03-10 DIAGNOSIS — I10 Essential (primary) hypertension: Secondary | ICD-10-CM | POA: Diagnosis not present

## 2016-03-10 DIAGNOSIS — Z79899 Other long term (current) drug therapy: Secondary | ICD-10-CM | POA: Diagnosis not present

## 2016-03-10 DIAGNOSIS — B20 Human immunodeficiency virus [HIV] disease: Secondary | ICD-10-CM | POA: Diagnosis not present

## 2016-03-10 DIAGNOSIS — R200 Anesthesia of skin: Secondary | ICD-10-CM

## 2016-03-10 DIAGNOSIS — G43C1 Periodic headache syndromes in child or adult, intractable: Secondary | ICD-10-CM | POA: Diagnosis not present

## 2016-03-10 DIAGNOSIS — R3981 Functional urinary incontinence: Secondary | ICD-10-CM | POA: Diagnosis not present

## 2016-03-10 DIAGNOSIS — G894 Chronic pain syndrome: Secondary | ICD-10-CM | POA: Diagnosis not present

## 2016-03-10 DIAGNOSIS — G603 Idiopathic progressive neuropathy: Secondary | ICD-10-CM | POA: Diagnosis not present

## 2016-03-10 DIAGNOSIS — K5900 Constipation, unspecified: Secondary | ICD-10-CM | POA: Diagnosis not present

## 2016-03-17 ENCOUNTER — Ambulatory Visit (HOSPITAL_COMMUNITY): Payer: Medicare Other

## 2016-03-26 ENCOUNTER — Ambulatory Visit: Payer: Medicare Other | Attending: Neurology | Admitting: Neurology

## 2016-03-26 VITALS — Ht 62.0 in | Wt 170.0 lb

## 2016-03-26 DIAGNOSIS — G4733 Obstructive sleep apnea (adult) (pediatric): Secondary | ICD-10-CM

## 2016-04-02 ENCOUNTER — Other Ambulatory Visit: Payer: Self-pay | Admitting: Gastroenterology

## 2016-04-02 DIAGNOSIS — K5900 Constipation, unspecified: Secondary | ICD-10-CM

## 2016-04-12 NOTE — Procedures (Signed)
  [place] A. Merlene Laughter, MD     [email or web address]             NOCTURNAL POLYSOMNOGRAPHY   LOCATION: [facility]   Patient Name: Jacqueline Lowe, Jacqueline Lowe Date: 03/26/2016 Gender: Female D.O.B: 1976-01-21 Age (years): 40 Referring Provider: Phillips Odor MD, ABSM Height (inches): 62 Interpreting Physician: Phillips Odor MD, ABSM Weight (lbs): 170 RPSGT: Peak, Robert BMI: 31 MRN: [id number] Neck Size: 13.75 CLINICAL INFORMATION Sleep Study Type: NPSG  Indication for sleep study: N/A  Epworth Sleepiness Score: 7  SLEEP STUDY TECHNIQUE As per the AASM Manual for the Scoring of Sleep and Associated Events v2.3 (April 2016) with a hypopnea requiring 4% desaturations.  The channels recorded and monitored were frontal, central and occipital EEG, electrooculogram (EOG), submentalis EMG (chin), nasal and oral airflow, thoracic and abdominal wall motion, anterior tibialis EMG, snore microphone, electrocardiogram, and pulse oximetry.  MEDICATIONS Medications self-administered by patient taken the night of the study : N/A  SLEEP ARCHITECTURE The study was initiated at 11:09:13 PM and ended at 3:55:53 AM.  Sleep onset time was N/A minutes and the sleep efficiency was 0.0%. The total sleep time was 0.0 minutes.  Stage REM latency was N/A minutes.  The patient spent N/A% of the night in stage N1 sleep, N/A% in stage N2 sleep, N/A% in stage N3 and N/A% in REM.  Alpha intrusion was absent.  Supine sleep was N/A%.  RESPIRATORY PARAMETERS The overall apnea/hypopnea index (AHI) was N/A per hour. There were N/A total apneas, including N/A obstructive, N/A central and N/A mixed apneas. There were N/A hypopneas and N/A RERAs.  The AHI during Stage REM sleep was N/A per hour.  AHI while supine was N/A per hour.  The mean oxygen saturation was N/A%. The minimum SpO2 during sleep was N/A%.  snoring was noted during this study.  CARDIAC DATA The 2 lead EKG  demonstrated sinus rhythm. The mean heart rate was N/A beats per minute. Other EKG findings include: None. LEG MOVEMENT DATA The total PLMS were N/A with a resulting PLMS index of N/A. Associated arousal with leg movement index was N/A.  IMPRESSIONS - The patient terminated study after about 2hr as she could not tolerate wires.  Delano Metz, MD Diplomate, American Board of Sleep Medicine.

## 2016-04-29 ENCOUNTER — Other Ambulatory Visit: Payer: Self-pay | Admitting: Family Medicine

## 2016-04-29 ENCOUNTER — Ambulatory Visit (INDEPENDENT_AMBULATORY_CARE_PROVIDER_SITE_OTHER): Payer: Medicare Other | Admitting: Family Medicine

## 2016-04-29 ENCOUNTER — Other Ambulatory Visit: Payer: Self-pay | Admitting: Urology

## 2016-04-29 ENCOUNTER — Encounter: Payer: Self-pay | Admitting: Family Medicine

## 2016-04-29 VITALS — BP 143/94 | HR 98 | Temp 98.3°F | Resp 16 | Ht 62.0 in | Wt 168.0 lb

## 2016-04-29 DIAGNOSIS — J069 Acute upper respiratory infection, unspecified: Secondary | ICD-10-CM

## 2016-04-29 DIAGNOSIS — R0982 Postnasal drip: Secondary | ICD-10-CM | POA: Diagnosis not present

## 2016-04-29 DIAGNOSIS — R339 Retention of urine, unspecified: Secondary | ICD-10-CM

## 2016-04-29 DIAGNOSIS — I10 Essential (primary) hypertension: Secondary | ICD-10-CM

## 2016-04-29 DIAGNOSIS — R05 Cough: Secondary | ICD-10-CM

## 2016-04-29 DIAGNOSIS — R338 Other retention of urine: Secondary | ICD-10-CM

## 2016-04-29 DIAGNOSIS — R059 Cough, unspecified: Secondary | ICD-10-CM

## 2016-04-29 MED ORDER — AZITHROMYCIN 250 MG PO TABS: ORAL_TABLET | ORAL | 0 refills | Status: DC

## 2016-04-29 MED ORDER — CHLORPHEN-PE-ACETAMINOPHEN 4-10-325 MG PO TABS: 1.0000 | ORAL_TABLET | Freq: Four times a day (QID) | ORAL | 0 refills | Status: DC | PRN

## 2016-04-29 NOTE — Patient Instructions (Addendum)
If cough persists, please call office!!  Upper Respiratory Infection, Adult Most upper respiratory infections (URIs) are caused by a virus. A URI affects the nose, throat, and upper air passages. The most common type of URI is often called "the common cold." Follow these instructions at home:  Take medicines only as told by your doctor.  Gargle warm saltwater or take cough drops to comfort your throat as told by your doctor.  Use a warm mist humidifier or inhale steam from a shower to increase air moisture. This may make it easier to breathe.  Drink enough fluid to keep your pee (urine) clear or pale yellow.  Eat soups and other clear broths.  Have a healthy diet.  Rest as needed.  Go back to work when your fever is gone or your doctor says it is okay.  You may need to stay home longer to avoid giving your URI to others.  You can also wear a face mask and wash your hands often to prevent spread of the virus.  Use your inhaler more if you have asthma.  Do not use any tobacco products, including cigarettes, chewing tobacco, or electronic cigarettes. If you need help quitting, ask your doctor. Contact a doctor if:  You are getting worse, not better.  Your symptoms are not helped by medicine.  You have chills.  You are getting more short of breath.  You have brown or red mucus.  You have yellow or brown discharge from your nose.  You have pain in your face, especially when you bend forward.  You have a fever.  You have puffy (swollen) neck glands.  You have pain while swallowing.  You have white areas in the back of your throat. Get help right away if:  You have very bad or constant:  Headache.  Ear pain.  Pain in your forehead, behind your eyes, and over your cheekbones (sinus pain).  Chest pain.  You have long-lasting (chronic) lung disease and any of the following:  Wheezing.  Long-lasting cough.  Coughing up blood.  A change in your usual  mucus.  You have a stiff neck.  You have changes in your:  Vision.  Hearing.  Thinking.  Mood. This information is not intended to replace advice given to you by your health care provider. Make sure you discuss any questions you have with your health care provider. Document Released: 10/01/2007 Document Revised: 12/16/2015 Document Reviewed: 07/20/2013 Elsevier Interactive Patient Education  2017 [employer].

## 2016-04-29 NOTE — Progress Notes (Signed)
Subjective:    Patient ID: Jacqueline Lowe, female    DOB: 02/02/1976, 41 y.o.   MRN: [id number]  Hypertension  This is a chronic problem. The current episode started more than 1 year ago. The problem has been rapidly improving since onset. The problem is uncontrolled. Pertinent negatives include no anxiety, blurred vision, chest pain, headaches, neck pain, orthopnea, palpitations, peripheral edema, PND, shortness of breath or sweats. There are no associated agents to hypertension. Risk factors for coronary artery disease include dyslipidemia. Past treatments include beta blockers. Compliance problems include exercise.  There is no history of angina, kidney disease, CAD/MI, CVA, heart failure, left ventricular hypertrophy, PVD, renovascular disease, retinopathy or a thyroid problem. There is no history of chronic renal disease, coarctation of the aorta, hyperaldosteronism, hypercortisolism, a hypertension causing med, pheochromocytoma or sleep apnea.  URI   This is a new problem. There has been no fever. Associated symptoms include coughing, sneezing and a sore throat. Pertinent negatives include no chest pain, diarrhea, dysuria, ear pain, headaches, neck pain, plugged ear sensation, rash, rhinorrhea, sinus pain, swollen glands, vomiting or wheezing. She has tried nothing for the symptoms. The treatment provided mild relief.    Past Medical History:  Diagnosis Date  . Anemia   . Cervical cancer (HCC)    dx at age 39  . HIV (human immunodeficiency virus infection) ([place])    reported to be HIV+ by patient  . HLD (hyperlipidemia)   . HTN (hypertension)    Social History   Social History  . Marital status: Single    Spouse name: N/A  . Number of children: 2  . Years of education: N/A   Occupational History  . Not on file.   Social History Main Topics  . Smoking status: Former Smoker    Quit date: 04/28/1997  . Smokeless tobacco: Never Used  . Alcohol use 0.0 oz/week     Comment:  socially  . Drug use: No  . Sexual activity: Yes     Comment: declined condoms   Other Topics Concern  . Not on file   Social History Narrative  . No narrative on file    Review of Systems  Constitutional: Positive for fatigue and fever.  HENT: Positive for sinus pressure, sneezing and sore throat. Negative for ear pain, rhinorrhea and sinus pain.   Eyes: Negative.  Negative for blurred vision, photophobia, redness and visual disturbance.  Respiratory: Positive for cough. Negative for chest tightness, shortness of breath and wheezing.   Cardiovascular: Negative.  Negative for chest pain, palpitations, orthopnea and PND.  Gastrointestinal: Negative.  Negative for diarrhea and vomiting.  Genitourinary: Negative for dysuria.  Musculoskeletal: Negative.  Negative for neck pain.  Skin: Negative.  Negative for rash.  Allergic/Immunologic: Positive for immunocompromised state (HIV +).  Neurological: Negative.  Negative for headaches.  Hematological: Negative.   Psychiatric/Behavioral: Negative.       Objective:   Physical Exam  Constitutional: She is oriented to person, place, and time. She appears well-developed and well-nourished.  HENT:  Head: Normocephalic and atraumatic.  Right Ear: External ear normal. Tympanic membrane is erythematous.  Left Ear: External ear normal.  Nose: Mucosal edema and rhinorrhea present. Right sinus exhibits frontal sinus tenderness.  Mouth/Throat: Oropharynx is clear and moist.  Eyes: Conjunctivae and EOM are normal. Pupils are equal, round, and reactive to light.  Neck: Normal range of motion. Neck supple.  Cardiovascular: Normal rate, regular rhythm, normal heart sounds and intact distal pulses.   Pulmonary/Chest:  Effort normal and breath sounds normal.  Abdominal: Soft. Bowel sounds are normal.  Musculoskeletal: Normal range of motion.  Neurological: She is alert and oriented to person, place, and time. She has normal reflexes.  Skin: Skin is  warm and dry.  Psychiatric: She has a normal mood and affect. Her behavior is normal. Judgment and thought content normal.     BP (!) 143/94 (BP Location: Right Arm, Patient Position: Sitting, Cuff Size: Normal)   Pulse 98   Temp 98.3 F (36.8 C) (Oral)   Resp 16   Ht 5\' 2"  (1.575 m)   Wt 168 lb (76.2 kg)   LMP 10/02/2011   SpO2 100%   BMI 30.73 kg/m  Assessment & Plan:  1. Essential hypertension The patient is asked to make an attempt to improve diet and exercise patterns to aid in medical management of this problem. - COMPLETE METABOLIC PANEL WITH GFR - Urinalysis, Routine w reflex microscopic  2. Acute upper respiratory infection - azithromycin (ZITHROMAX) 250 MG tablet; Take 500 mg today; Take 250 mg on days 2-5  Dispense: 6 tablet; Refill: 0 - Chlorphen-PE-Acetaminophen 4-10-325 MG TABS; Take 1 tablet by mouth every 6 (six) hours as needed.  Dispense: 20 tablet; Refill: 0  3. Cough - Chlorphen-PE-Acetaminophen 4-10-325 MG TABS; Take 1 tablet by mouth every 6 (six) hours as needed.  Dispense: 20 tablet; Refill: 0   4. Post-nasal drip - Chlorphen-PE-Acetaminophen 4-10-325 MG TABS; Take 1 tablet by mouth every 6 (six) hours as needed.  Dispense: 20 tablet; Refill: 0    RTC: 3 months for hypertension [name], FNP

## 2016-04-30 ENCOUNTER — Other Ambulatory Visit: Payer: Self-pay | Admitting: Urology

## 2016-04-30 DIAGNOSIS — R3915 Urgency of urination: Secondary | ICD-10-CM

## 2016-04-30 DIAGNOSIS — N312 Flaccid neuropathic bladder, not elsewhere classified: Secondary | ICD-10-CM

## 2016-04-30 LAB — COMPLETE METABOLIC PANEL WITH GFR
ALT: 9 U/L (ref 6–29)
AST: 15 U/L (ref 10–30)
Albumin: 4.5 g/dL (ref 3.6–5.1)
Alkaline Phosphatase: 75 U/L (ref 33–115)
BILIRUBIN TOTAL: 0.4 mg/dL (ref 0.2–1.2)
BUN: 10 mg/dL (ref 7–25)
CHLORIDE: 104 mmol/L (ref 98–110)
CO2: 25 mmol/L (ref 20–31)
CREATININE: 0.63 mg/dL (ref 0.50–1.10)
Calcium: 10.4 mg/dL — ABNORMAL HIGH (ref 8.6–10.2)
GFR, Est Non African American: >89 mL/min (ref >=60)
GLUCOSE: 92 mg/dL (ref 65–99)
Potassium: 3.6 mmol/L (ref 3.5–5.3)
Sodium: 138 mmol/L (ref 135–146)
TOTAL PROTEIN: 8.1 g/dL (ref 6.1–8.1)

## 2016-04-30 LAB — URINALYSIS, ROUTINE W REFLEX MICROSCOPIC
BILIRUBIN URINE: NEGATIVE
GLUCOSE, UA: NEGATIVE
HGB URINE DIPSTICK: NEGATIVE
KETONES UR: NEGATIVE
Leukocytes, UA: NEGATIVE
Nitrite: NEGATIVE
PH: 6.5 (ref 5.0–8.0)
Protein, ur: NEGATIVE
SPECIFIC GRAVITY, URINE: 1.02 (ref 1.001–1.035)

## 2016-05-01 ENCOUNTER — Ambulatory Visit (HOSPITAL_COMMUNITY)
Admission: RE | Admit: 2016-05-01 | Discharge: 2016-05-01 | Disposition: A | Discharge status: Home / Self Care | Payer: Medicare Other | Source: Ambulatory Visit | Attending: Urology | Admitting: Urology

## 2016-05-01 DIAGNOSIS — N312 Flaccid neuropathic bladder, not elsewhere classified: Secondary | ICD-10-CM | POA: Insufficient documentation

## 2016-05-01 DIAGNOSIS — R3915 Urgency of urination: Secondary | ICD-10-CM

## 2016-05-05 ENCOUNTER — Other Ambulatory Visit: Payer: Self-pay | Admitting: Gastroenterology

## 2016-05-05 DIAGNOSIS — K5900 Constipation, unspecified: Secondary | ICD-10-CM

## 2016-05-12 DIAGNOSIS — G894 Chronic pain syndrome: Secondary | ICD-10-CM | POA: Diagnosis not present

## 2016-05-12 DIAGNOSIS — M5412 Radiculopathy, cervical region: Secondary | ICD-10-CM | POA: Diagnosis not present

## 2016-05-12 DIAGNOSIS — B20 Human immunodeficiency virus [HIV] disease: Secondary | ICD-10-CM | POA: Diagnosis not present

## 2016-05-12 DIAGNOSIS — F5101 Primary insomnia: Secondary | ICD-10-CM | POA: Diagnosis not present

## 2016-05-12 DIAGNOSIS — G603 Idiopathic progressive neuropathy: Secondary | ICD-10-CM | POA: Diagnosis not present

## 2016-05-12 DIAGNOSIS — M5416 Radiculopathy, lumbar region: Secondary | ICD-10-CM | POA: Diagnosis not present

## 2016-05-30 ENCOUNTER — Other Ambulatory Visit: Payer: Self-pay | Admitting: Gastroenterology

## 2016-05-30 DIAGNOSIS — K5900 Constipation, unspecified: Secondary | ICD-10-CM

## 2016-06-27 ENCOUNTER — Other Ambulatory Visit: Payer: Self-pay | Admitting: Gastroenterology

## 2016-06-27 ENCOUNTER — Other Ambulatory Visit: Payer: Self-pay | Admitting: Internal Medicine

## 2016-06-27 DIAGNOSIS — K5900 Constipation, unspecified: Secondary | ICD-10-CM

## 2016-06-27 DIAGNOSIS — B20 Human immunodeficiency virus [HIV] disease: Secondary | ICD-10-CM

## 2016-07-03 ENCOUNTER — Other Ambulatory Visit: Payer: Self-pay | Admitting: Family Medicine

## 2016-07-03 DIAGNOSIS — E785 Hyperlipidemia, unspecified: Secondary | ICD-10-CM

## 2016-07-09 ENCOUNTER — Ambulatory Visit (INDEPENDENT_AMBULATORY_CARE_PROVIDER_SITE_OTHER): Payer: Medicare Other | Admitting: Urology

## 2016-07-09 DIAGNOSIS — R3915 Urgency of urination: Secondary | ICD-10-CM

## 2016-07-09 DIAGNOSIS — R350 Frequency of micturition: Secondary | ICD-10-CM | POA: Diagnosis not present

## 2016-07-28 ENCOUNTER — Other Ambulatory Visit: Payer: Self-pay | Admitting: Gastroenterology

## 2016-07-28 ENCOUNTER — Encounter: Payer: Self-pay | Admitting: Family Medicine

## 2016-07-28 ENCOUNTER — Ambulatory Visit (INDEPENDENT_AMBULATORY_CARE_PROVIDER_SITE_OTHER): Payer: Medicare Other | Admitting: Family Medicine

## 2016-07-28 VITALS — BP 134/84 | HR 94 | Temp 98.7°F | Resp 16 | Ht 64.0 in | Wt 169.0 lb

## 2016-07-28 DIAGNOSIS — I10 Essential (primary) hypertension: Secondary | ICD-10-CM | POA: Diagnosis not present

## 2016-07-28 DIAGNOSIS — K5900 Constipation, unspecified: Secondary | ICD-10-CM

## 2016-07-28 DIAGNOSIS — E559 Vitamin D deficiency, unspecified: Secondary | ICD-10-CM | POA: Diagnosis not present

## 2016-07-28 DIAGNOSIS — E785 Hyperlipidemia, unspecified: Secondary | ICD-10-CM | POA: Diagnosis not present

## 2016-07-28 DIAGNOSIS — B20 Human immunodeficiency virus [HIV] disease: Secondary | ICD-10-CM

## 2016-07-28 DIAGNOSIS — R7303 Prediabetes: Secondary | ICD-10-CM

## 2016-07-28 DIAGNOSIS — R42 Dizziness and giddiness: Secondary | ICD-10-CM | POA: Diagnosis not present

## 2016-07-28 DIAGNOSIS — R002 Palpitations: Secondary | ICD-10-CM

## 2016-07-28 LAB — COMPLETE METABOLIC PANEL WITH GFR
ALBUMIN: 4.5 g/dL (ref 3.6–5.1)
ALK PHOS: 60 U/L (ref 33–115)
ALT: 10 U/L (ref 6–29)
AST: 14 U/L (ref 10–30)
BILIRUBIN TOTAL: 0.4 mg/dL (ref 0.2–1.2)
BUN: 13 mg/dL (ref 7–25)
CALCIUM: 10.2 mg/dL (ref 8.6–10.2)
CO2: 24 mmol/L (ref 20–31)
Chloride: 105 mmol/L (ref 98–110)
Creat: 0.85 mg/dL (ref 0.50–1.10)
GFR, EST NON AFRICAN AMERICAN: 87 mL/min (ref >=60)
GFR, Est African American: >89 mL/min (ref >=60)
Glucose, Bld: 104 mg/dL — ABNORMAL HIGH (ref 65–99)
Potassium: 3.8 mmol/L (ref 3.5–5.3)
Sodium: 137 mmol/L (ref 135–146)
Total Protein: 7.7 g/dL (ref 6.1–8.1)

## 2016-07-28 LAB — POCT URINALYSIS DIP (DEVICE)
BILIRUBIN URINE: NEGATIVE
Glucose, UA: NEGATIVE mg/dL
KETONES UR: NEGATIVE mg/dL
Leukocytes, UA: NEGATIVE
Nitrite: NEGATIVE
PH: 6.5 (ref 5.0–8.0)
Protein, ur: NEGATIVE mg/dL
SPECIFIC GRAVITY, URINE: 1.02 (ref 1.005–1.030)
Urobilinogen, UA: 0.2 mg/dL (ref 0.0–1.0)

## 2016-07-28 LAB — LIPID PANEL
CHOL/HDL RATIO: 8.6 ratio — AB (ref <5.0)
CHOLESTEROL: 257 mg/dL — AB (ref <200)
HDL: 30 mg/dL — ABNORMAL LOW (ref >50)
LDL Cholesterol: 194 mg/dL — ABNORMAL HIGH (ref <100)
Triglycerides: 167 mg/dL — ABNORMAL HIGH (ref <150)
VLDL: 33 mg/dL — ABNORMAL HIGH (ref <30)

## 2016-07-28 LAB — POCT GLYCOSYLATED HEMOGLOBIN (HGB A1C): HEMOGLOBIN A1C: 5.5

## 2016-07-28 NOTE — Patient Instructions (Addendum)
Palpitations A palpitation is the feeling that your heart:  Has an uneven (irregular) heartbeat.  Is beating faster than normal.  Is fluttering.  Is skipping a beat. This is usually not a serious problem. In some cases, you may need more medical tests. Follow these instructions at home:  Avoid:  Caffeine in coffee, tea, soft drinks, diet pills, and energy drinks.  Chocolate.  Alcohol.  Do not use any tobacco products. These include cigarettes, chewing tobacco, and e-cigarettes. If you need help quitting, ask your doctor.  Try to reduce your stress. These things may help:  Yoga.  Meditation.  Physical activity. Swimming, jogging, and walking are good choices.  A method that helps you use your mind to control things in your body, like heartbeats (biofeedback).  Get plenty of rest and sleep.  Take over-the-counter and prescription medicines only as told by your doctor.  Keep all follow-up visits as told by your doctor. This is important. Contact a doctor if:  Your heartbeat is still fast or uneven after 24 hours.  Your palpitations occur more often. Get help right away if:  You have chest pain.  You feel short of breath.  You have a very bad headache.  You feel dizzy.  You pass out (faint). This information is not intended to replace advice given to you by your health care provider. Make sure you discuss any questions you have with your health care provider. Document Released: 01/22/2008 Document Revised: 09/20/2015 Document Reviewed: 12/28/2014 Elsevier Interactive Patient Education  2017 [employer].

## 2016-07-28 NOTE — Progress Notes (Signed)
Subjective:    Patient ID: Jacqueline Lowe, female    DOB: Jun 08, 1975, 41 y.o.   MRN: [id number]  HPI Jacqueline Lowe 41 year old female with a history of HIV and hypertension presents for a follow up of hypertension and hyperlipidemia. Jacqueline Lowe states that she has been taking medication consistently. Reviewed previous lipid profile, total cholesterol is 268, goal is < 200, LDL cholesterol is 208, goal is < 100. She hasbeen following a low fat diet or exercise regimen.  Jacqueline Lowe also has a history of hypertension. She endorses periodic heart palpitations at rest. Heart palpitations last occurred this am. She denies chest pains with radiation. She has not been checking blood pressures at home. She denies dizziness, chest pains, lower extremity edema, nausea, vomiting, or diarrhea.   Past Medical History:  Diagnosis Date  . Anemia   . Cervical cancer (HCC)    dx at age 5  . HIV (human immunodeficiency virus infection) ([place])    reported to be HIV+ by patient  . HLD (hyperlipidemia)   . HTN (hypertension)    Social History   Social History  . Marital status: Single    Spouse name: N/A  . Number of children: 2  . Years of education: N/A   Occupational History  . Not on file.   Social History Main Topics  . Smoking status: Former Smoker    Quit date: 04/28/1997  . Smokeless tobacco: Never Used  . Alcohol use 0.0 oz/week     Comment: socially  . Drug use: No  . Sexual activity: Yes     Comment: declined condoms   Other Topics Concern  . Not on file   Social History Narrative  . No narrative on file   Past Surgical History:  Procedure Laterality Date  . ABDOMINAL HYSTERECTOMY     excessive bleeding; ovaries remain intact  . LEEP     Review of Systems  Constitutional: Negative.  Negative for fatigue and unexpected weight change.  HENT: Negative.   Eyes: Negative.  Negative for visual disturbance.  Respiratory: Negative.   Cardiovascular: Negative.   Negative for leg swelling.  Gastrointestinal: Negative for abdominal distention, blood in stool, constipation, diarrhea, nausea, rectal pain and vomiting.  Endocrine: Negative.  Negative for polydipsia, polyphagia and polyuria.  Genitourinary: Negative for dysuria and urgency.  Musculoskeletal: Negative.   Skin: Negative.   Allergic/Immunologic: Negative.  Negative for immunocompromised state.  Neurological: Negative.  Negative for dizziness and headaches.  Hematological: Negative.   Psychiatric/Behavioral: Negative.  Negative for agitation.      Objective:   Physical Exam  Constitutional: She is oriented to person, place, and time. She appears well-developed and well-nourished.  HENT:  Head: Normocephalic and atraumatic.  Right Ear: External ear normal.  Left Ear: External ear normal.  Mouth/Throat: Oropharynx is clear and moist.  Eyes: Conjunctivae and EOM are normal. Pupils are equal, round, and reactive to light.  Neck: Normal range of motion. Neck supple.  Cardiovascular: Normal rate, regular rhythm, normal heart sounds and intact distal pulses.   Pulmonary/Chest: Effort normal and breath sounds normal.  Abdominal: Soft. Bowel sounds are normal.  Musculoskeletal: Normal range of motion.  Neurological: She is alert and oriented to person, place, and time. She has normal reflexes.  Skin: Skin is warm and dry.  Psychiatric: She has a normal mood and affect. Her behavior is normal. Judgment and thought content normal.    BP 134/84 (BP Location: Left Arm, Patient Position: Sitting, Cuff  Size: Normal)   Pulse 94   Temp 98.7 F (37.1 C) (Oral)   Resp 16   Ht 5\' 4"  (1.626 m)   Wt 169 lb (76.7 kg)   LMP 10/02/2011   SpO2 100%   BMI 29.01 kg/m  Assessment & Plan:  1. Essential hypertension Blood pressure is at goal on current medication regimen. Will continue as previously prescribed.  - COMPLETE METABOLIC PANEL WITH GFR  2. Hyperlipidemia, unspecified hyperlipidemia  type The patient is asked to make an attempt to improve diet and exercise patterns to aid in medical management of this problem. - Lipid Panel  3. Heart palpitations EKG consistent with previous.  - EKG 12-Lead  4. Prediabetes Recommend a lowfat, low carbohydrate diet divided over 5-6 small meals, increase water intake to 6-8 glasses, and 150 minutes per week of cardiovascular exercise.   - HgB A1c  5. Dizziness - Orthostatic vital signs  6. Human immunodeficiency virus disease ([place]) Recommend that patient follow up with Dr. Linus Salmons as scheduled    RTC: 3 months for hypertension and hyperlipidemia  The patient was given clear instructions to go to ER or return to medical center if symptoms do not improve, worsen or new problems develop. The patient verbalized understanding. Will notify patient with laboratory results. [name], FNP

## 2016-07-30 DIAGNOSIS — B20 Human immunodeficiency virus [HIV] disease: Secondary | ICD-10-CM | POA: Diagnosis not present

## 2016-07-30 DIAGNOSIS — G894 Chronic pain syndrome: Secondary | ICD-10-CM | POA: Diagnosis not present

## 2016-07-30 DIAGNOSIS — M5416 Radiculopathy, lumbar region: Secondary | ICD-10-CM | POA: Diagnosis not present

## 2016-07-30 DIAGNOSIS — F5101 Primary insomnia: Secondary | ICD-10-CM | POA: Diagnosis not present

## 2016-07-30 DIAGNOSIS — G603 Idiopathic progressive neuropathy: Secondary | ICD-10-CM | POA: Diagnosis not present

## 2016-08-13 ENCOUNTER — Ambulatory Visit: Payer: Medicare Other | Admitting: Urology

## 2016-08-18 ENCOUNTER — Other Ambulatory Visit (HOSPITAL_COMMUNITY)
Admission: RE | Admit: 2016-08-18 | Discharge: 2016-08-18 | Disposition: A | Discharge status: Home / Self Care | Payer: Medicare Other | Source: Ambulatory Visit | Attending: Internal Medicine | Admitting: Internal Medicine

## 2016-08-18 ENCOUNTER — Ambulatory Visit (INDEPENDENT_AMBULATORY_CARE_PROVIDER_SITE_OTHER): Payer: Medicare Other | Admitting: Internal Medicine

## 2016-08-18 ENCOUNTER — Encounter: Payer: Self-pay | Admitting: Internal Medicine

## 2016-08-18 VITALS — BP 118/77 | HR 86 | Temp 98.2°F | Ht 64.0 in | Wt 168.0 lb

## 2016-08-18 DIAGNOSIS — B20 Human immunodeficiency virus [HIV] disease: Secondary | ICD-10-CM

## 2016-08-18 DIAGNOSIS — Z113 Encounter for screening for infections with a predominantly sexual mode of transmission: Secondary | ICD-10-CM | POA: Diagnosis not present

## 2016-08-18 NOTE — Progress Notes (Signed)
   Subjective:    Patient ID: Jacqueline Lowe, female    DOB: 05-06-1975, 41 y.o.   MRN: [id number]  HPI Here for follow up of HIV.   On Genvoya and denies any missed doses. Still struggles with sleeping, now does not stay asleep. On treatment per her psychiatrist.  Had a pap smear 2 months ago with her GYN.  No weight loss, no diarrhea. Continues to refuse all vaccines.     Review of Systems  Constitutional: Positive for fatigue.  Gastrointestinal: Negative for diarrhea and nausea.  Skin: Negative for rash.  Psychiatric/Behavioral: Positive for sleep disturbance.       Objective:   Physical Exam  Constitutional: She appears well-developed and well-nourished.  Eyes: No scleral icterus.  Cardiovascular: Normal rate, regular rhythm and normal heart sounds.   No murmur heard. Pulmonary/Chest: Effort normal and breath sounds normal. No respiratory distress.  Skin: No rash noted.          Assessment & Plan:

## 2016-08-18 NOTE — Assessment & Plan Note (Signed)
Doing well.  Labs today and rtc 6 months unless concerns.  

## 2016-08-18 NOTE — Assessment & Plan Note (Signed)
Yearly screening today

## 2016-08-19 LAB — URINE CYTOLOGY ANCILLARY ONLY
Chlamydia: NEGATIVE
NEISSERIA GONORRHEA: NEGATIVE

## 2016-08-19 LAB — RPR

## 2016-08-19 LAB — T-HELPER CELL (CD4) - (RCID CLINIC ONLY)
CD4 % Helper T Cell: 43 % (ref 33–55)
CD4 T Cell Abs: 980 /uL (ref 400–2700)

## 2016-08-20 LAB — HIV-1 RNA QUANT-NO REFLEX-BLD
HIV 1 RNA Quant: <20 copies/mL
HIV-1 RNA Quant, Log: <1.3 Log copies/mL

## 2016-08-25 ENCOUNTER — Other Ambulatory Visit: Payer: Self-pay | Admitting: Gastroenterology

## 2016-08-25 DIAGNOSIS — K5900 Constipation, unspecified: Secondary | ICD-10-CM

## 2016-09-26 ENCOUNTER — Other Ambulatory Visit: Payer: Self-pay | Admitting: Internal Medicine

## 2016-09-26 ENCOUNTER — Other Ambulatory Visit: Payer: Self-pay | Admitting: Gastroenterology

## 2016-09-26 DIAGNOSIS — B20 Human immunodeficiency virus [HIV] disease: Secondary | ICD-10-CM

## 2016-09-26 DIAGNOSIS — K5900 Constipation, unspecified: Secondary | ICD-10-CM

## 2016-10-10 ENCOUNTER — Other Ambulatory Visit: Payer: Self-pay | Admitting: Family Medicine

## 2016-10-10 DIAGNOSIS — E785 Hyperlipidemia, unspecified: Secondary | ICD-10-CM

## 2016-10-22 DIAGNOSIS — R3981 Functional urinary incontinence: Secondary | ICD-10-CM | POA: Diagnosis not present

## 2016-10-22 DIAGNOSIS — G894 Chronic pain syndrome: Secondary | ICD-10-CM | POA: Diagnosis not present

## 2016-10-22 DIAGNOSIS — M5416 Radiculopathy, lumbar region: Secondary | ICD-10-CM | POA: Diagnosis not present

## 2016-10-22 DIAGNOSIS — E559 Vitamin D deficiency, unspecified: Secondary | ICD-10-CM | POA: Diagnosis not present

## 2016-10-22 DIAGNOSIS — G603 Idiopathic progressive neuropathy: Secondary | ICD-10-CM | POA: Diagnosis not present

## 2016-10-22 DIAGNOSIS — F5101 Primary insomnia: Secondary | ICD-10-CM | POA: Diagnosis not present

## 2016-10-22 DIAGNOSIS — B20 Human immunodeficiency virus [HIV] disease: Secondary | ICD-10-CM | POA: Diagnosis not present

## 2016-11-03 ENCOUNTER — Ambulatory Visit: Payer: Medicare Other | Admitting: Family Medicine

## 2017-01-01 ENCOUNTER — Other Ambulatory Visit: Payer: Self-pay | Admitting: Family Medicine

## 2017-01-01 DIAGNOSIS — E785 Hyperlipidemia, unspecified: Secondary | ICD-10-CM

## 2017-01-27 ENCOUNTER — Other Ambulatory Visit: Payer: Self-pay | Admitting: Family Medicine

## 2017-01-27 DIAGNOSIS — E785 Hyperlipidemia, unspecified: Secondary | ICD-10-CM

## 2017-02-12 ENCOUNTER — Ambulatory Visit (INDEPENDENT_AMBULATORY_CARE_PROVIDER_SITE_OTHER): Payer: Medicaid Other | Admitting: Internal Medicine

## 2017-02-12 VITALS — BP 167/131 | HR 59 | Temp 98.6°F | Wt 161.0 lb

## 2017-02-12 DIAGNOSIS — Z7189 Other specified counseling: Secondary | ICD-10-CM | POA: Diagnosis not present

## 2017-02-12 DIAGNOSIS — B20 Human immunodeficiency virus [HIV] disease: Secondary | ICD-10-CM | POA: Diagnosis not present

## 2017-02-12 DIAGNOSIS — Z7185 Encounter for immunization safety counseling: Secondary | ICD-10-CM | POA: Insufficient documentation

## 2017-02-12 MED ORDER — ELVITEG-COBIC-EMTRICIT-TENOFAF 150-150-200-10 MG PO TABS: 1.0000 | ORAL_TABLET | Freq: Every day | ORAL | 11 refills | Status: DC

## 2017-02-12 NOTE — Assessment & Plan Note (Signed)
Refuses vaccines.

## 2017-02-12 NOTE — Progress Notes (Signed)
   Subjective:    Patient ID: Jacqueline Lowe, female    DOB: 1975/07/22, 41 y.o.   MRN: [id number]  HPI Here for follow up of HIV.   On Genvoya and denies any missed doses. No associated weight loss, no diarrhea. Continues to refuse all vaccines.     Review of Systems  Gastrointestinal: Negative for diarrhea and nausea.  Skin: Negative for rash.       Objective:   Physical Exam  Constitutional: She appears well-developed and well-nourished.  Eyes: No scleral icterus.  Cardiovascular: Normal rate, regular rhythm and normal heart sounds.   No murmur heard. Pulmonary/Chest: Effort normal and breath sounds normal. No respiratory distress.  Skin: No rash noted.          Assessment & Plan:

## 2017-02-12 NOTE — Assessment & Plan Note (Signed)
Doing well.  Labs today and rtc 6 months.  

## 2017-02-13 LAB — T-HELPER CELL (CD4) - (RCID CLINIC ONLY)
CD4 % Helper T Cell: 35 % (ref 33–55)
CD4 T CELL ABS: 560 /uL (ref 400–2700)

## 2017-02-16 LAB — HIV-1 RNA QUANT-NO REFLEX-BLD
HIV 1 RNA Quant: <20 copies/mL — AB
HIV-1 RNA Quant, Log: <1.3 Log copies/mL — AB

## 2017-02-19 ENCOUNTER — Ambulatory Visit: Payer: Medicare Other | Admitting: Internal Medicine

## 2017-02-25 ENCOUNTER — Other Ambulatory Visit: Payer: Self-pay | Admitting: Family Medicine

## 2017-02-25 DIAGNOSIS — E785 Hyperlipidemia, unspecified: Secondary | ICD-10-CM

## 2017-03-09 IMAGING — US US RENAL
1 series · 14 of 25 positions shown · non-contrast
Comparison: None.

CLINICAL DATA: Urinary urgency with history of ridge attic bladder

EXAM:
RENAL / URINARY TRACT ULTRASOUND COMPLETE

[Series 1: us renal · 0.24mm/px · 14 of 44 slices shown]
[im 1/44]
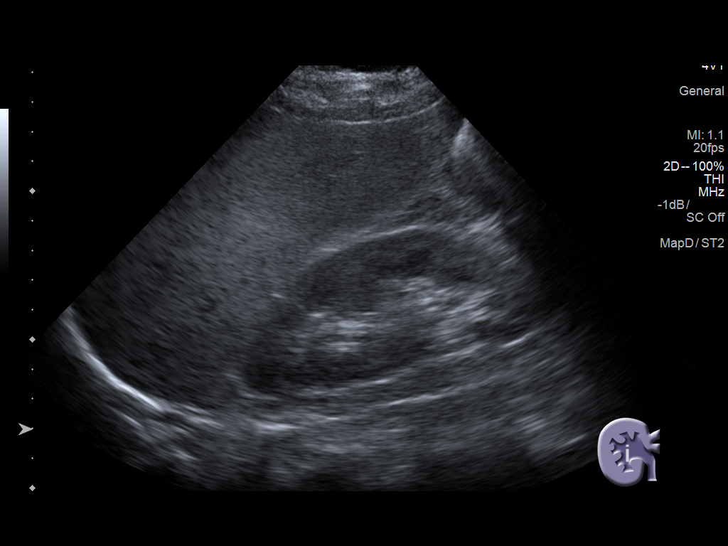
[im 4/44]
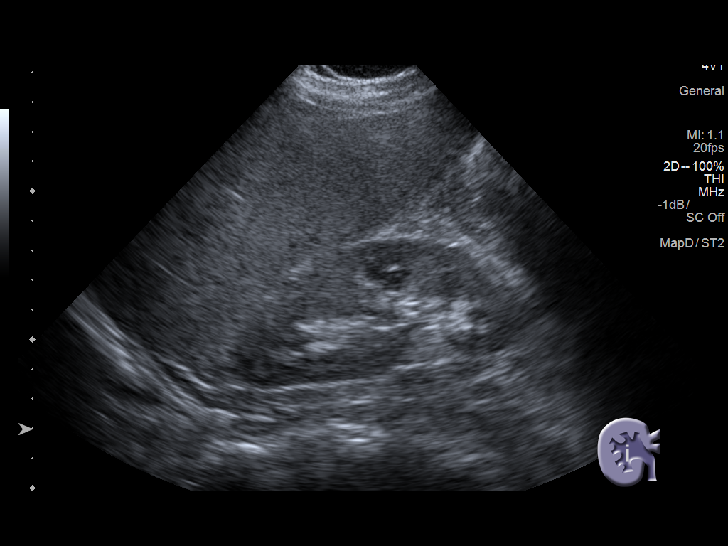
[im 8/44]
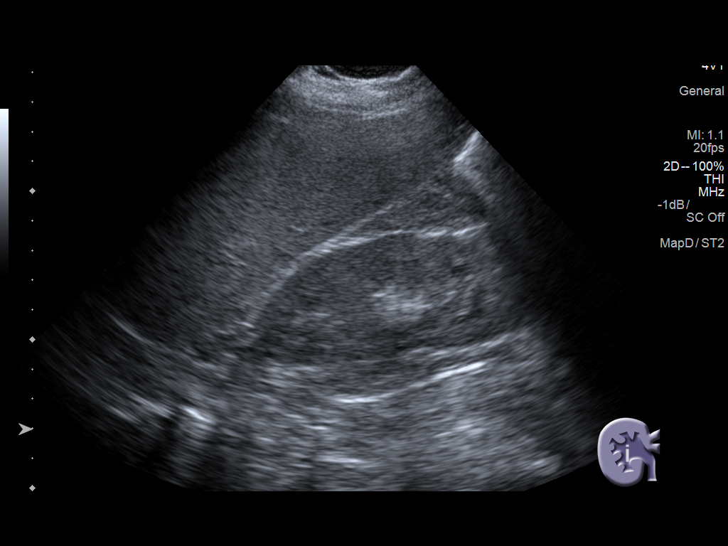
[im 11/44]
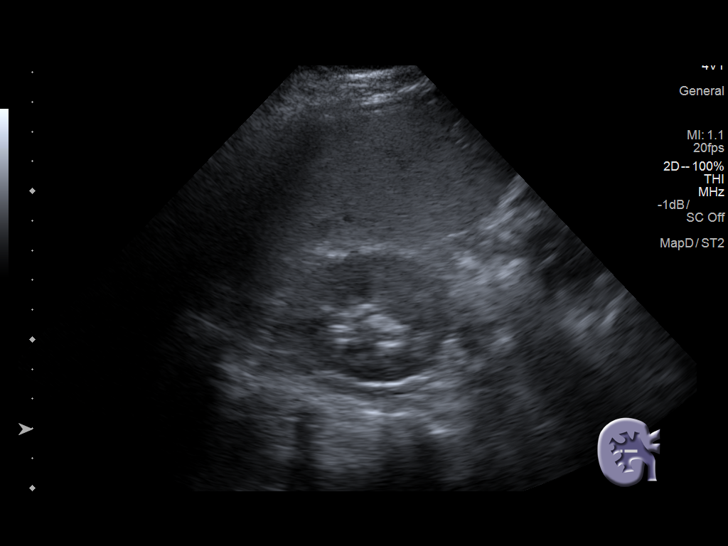
[im 15/44]
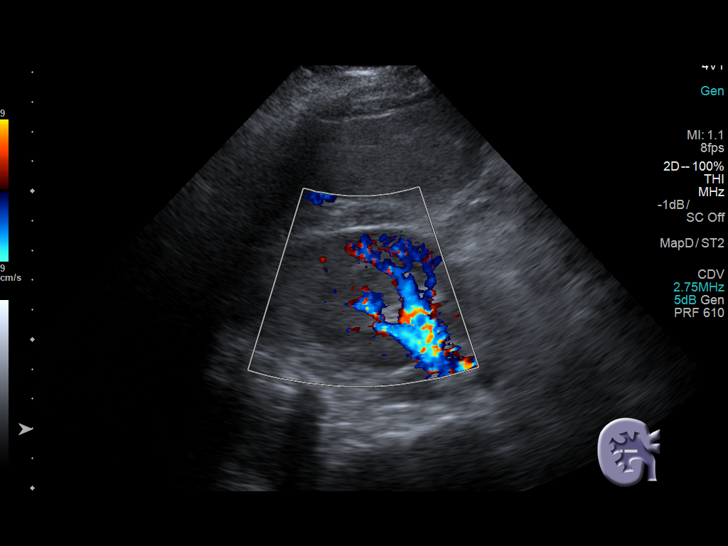
[im 17/44]
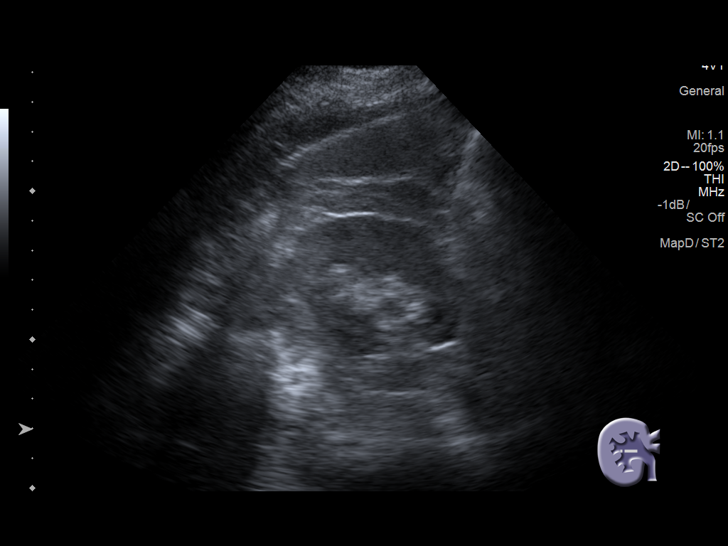
[im 20/44]
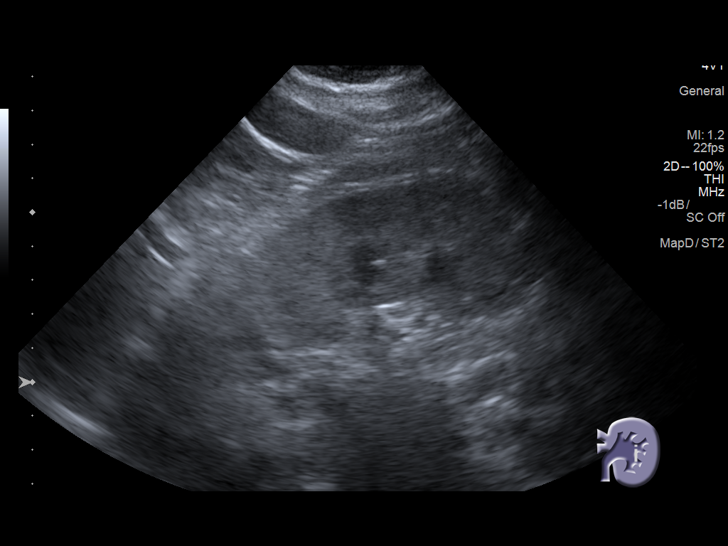
[im 24/44]
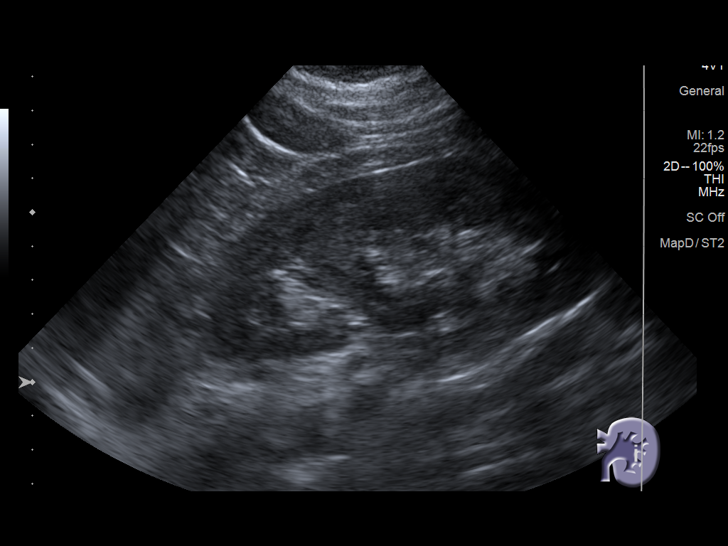
[im 27/44]
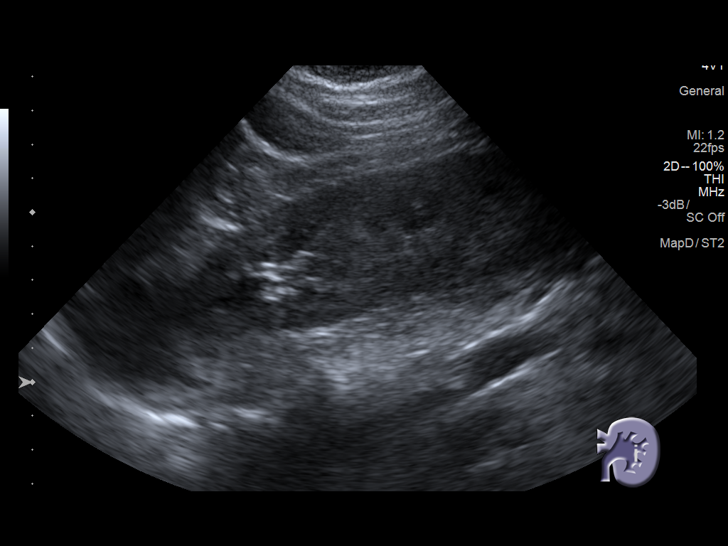
[im 29/44]
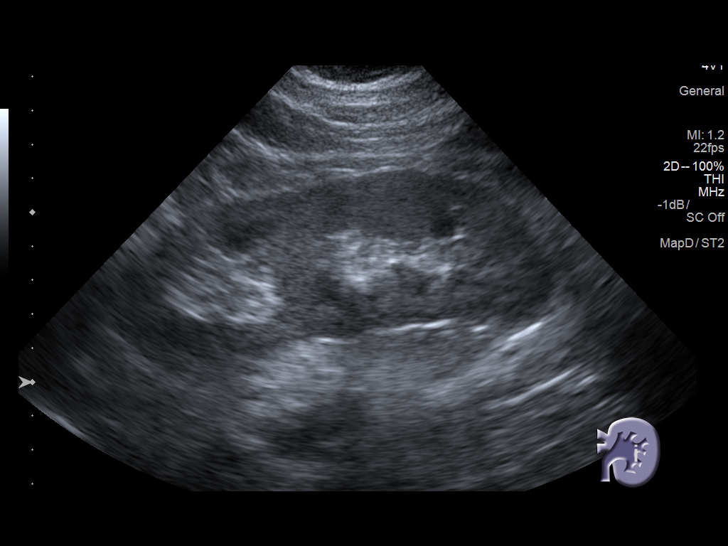
[im 33/44]
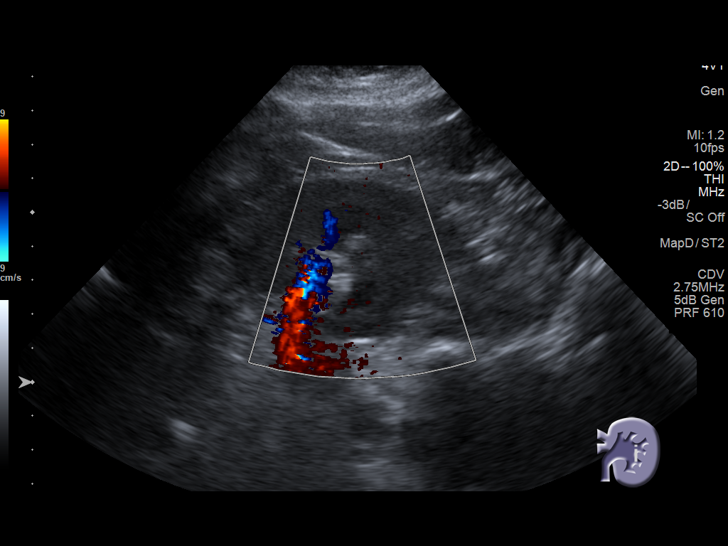
[im 36/44]
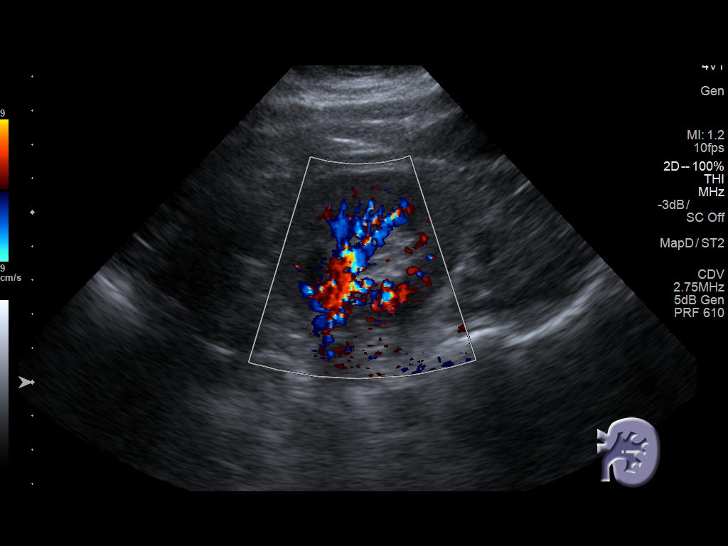
[im 40/44]
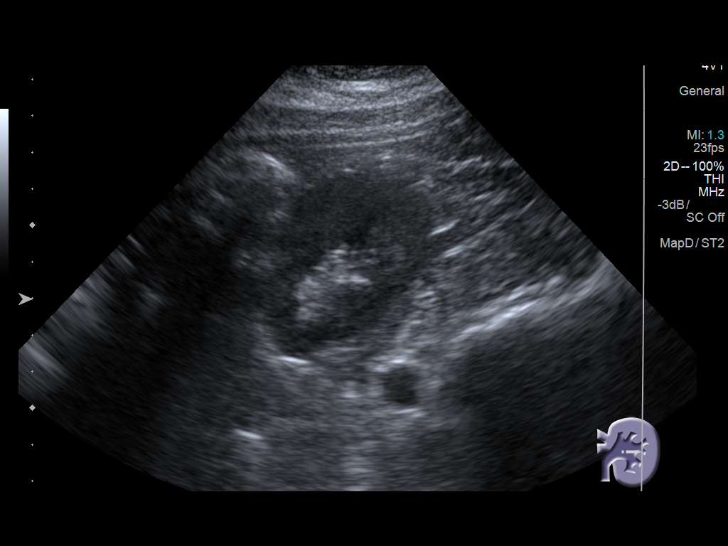
[im 44/44]
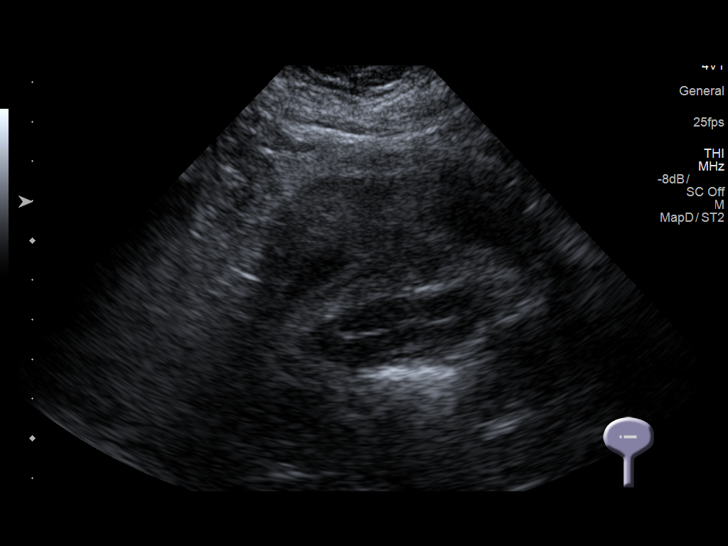

[14 of 25 positions shown; findings below may reference images not displayed]

FINDINGS: Right Kidney:

Length: 11.1 cm. Echogenicity and renal cortical thickness are
within normal limits. No mass, perinephric fluid, or hydronephrosis
visualized. No sonographically demonstrable calculus or
ureterectasis.

Left Kidney:

Length: 13.1 cm. Echogenicity and renal cortical thickness are
within normal limits. No mass, perinephric fluid, or hydronephrosis
visualized. No sonographically demonstrable calculus or
ureterectasis.

Bladder:

Appears normal for degree of bladder distention. Urinary bladder is
nearly completely empty.
IMPRESSION: Urinary bladder is nearly completely empty. Kidneys appear
unremarkable bilaterally. Note that there is borderline size
discrepancy between the kidneys. Significance of this finding is
uncertain. This finding potentially could indicate a degree of renal
artery stenosis on the right. In this regard, question whether
patient is hypertensive.

## 2017-03-26 ENCOUNTER — Other Ambulatory Visit: Payer: Self-pay | Admitting: Family Medicine

## 2017-03-26 DIAGNOSIS — E785 Hyperlipidemia, unspecified: Secondary | ICD-10-CM

## 2017-04-16 ENCOUNTER — Other Ambulatory Visit: Payer: Self-pay | Admitting: Family Medicine

## 2017-04-16 DIAGNOSIS — E785 Hyperlipidemia, unspecified: Secondary | ICD-10-CM

## 2017-05-19 ENCOUNTER — Other Ambulatory Visit: Payer: Self-pay | Admitting: Family Medicine

## 2017-05-19 DIAGNOSIS — E785 Hyperlipidemia, unspecified: Secondary | ICD-10-CM

## 2017-06-19 ENCOUNTER — Other Ambulatory Visit: Payer: Self-pay | Admitting: Family Medicine

## 2017-06-19 DIAGNOSIS — E785 Hyperlipidemia, unspecified: Secondary | ICD-10-CM

## 2017-08-13 ENCOUNTER — Encounter: Payer: Self-pay | Admitting: Internal Medicine

## 2017-08-13 ENCOUNTER — Ambulatory Visit (INDEPENDENT_AMBULATORY_CARE_PROVIDER_SITE_OTHER): Payer: Medicaid Other | Admitting: Internal Medicine

## 2017-08-13 ENCOUNTER — Other Ambulatory Visit (HOSPITAL_COMMUNITY)
Admission: RE | Admit: 2017-08-13 | Discharge: 2017-08-13 | Disposition: A | Discharge status: Home / Self Care | Payer: Medicaid Other | Source: Ambulatory Visit | Attending: Internal Medicine | Admitting: Internal Medicine

## 2017-08-13 VITALS — BP 168/113 | HR 98 | Temp 99.1°F | Ht 64.0 in | Wt 167.0 lb

## 2017-08-13 DIAGNOSIS — Z90710 Acquired absence of both cervix and uterus: Secondary | ICD-10-CM

## 2017-08-13 DIAGNOSIS — Z113 Encounter for screening for infections with a predominantly sexual mode of transmission: Secondary | ICD-10-CM | POA: Insufficient documentation

## 2017-08-13 DIAGNOSIS — B20 Human immunodeficiency virus [HIV] disease: Secondary | ICD-10-CM | POA: Insufficient documentation

## 2017-08-13 DIAGNOSIS — Z803 Family history of malignant neoplasm of breast: Secondary | ICD-10-CM

## 2017-08-13 DIAGNOSIS — Z79899 Other long term (current) drug therapy: Secondary | ICD-10-CM

## 2017-08-13 NOTE — Assessment & Plan Note (Signed)
Will refer her to our NP for evaluation of cervix, PAP smear if indicated.  Due for mammogram.

## 2017-08-13 NOTE — Assessment & Plan Note (Signed)
She is doing well and will do labs today, rtc 1 year.

## 2017-08-13 NOTE — Assessment & Plan Note (Signed)
Last mammogram over 2 years ago.  Will schedule with our NP for further care.

## 2017-08-13 NOTE — Progress Notes (Signed)
   Subjective:    Patient ID: Jacqueline Lowe, female    DOB: [birth year], 42 y.o.   MRN: [id number]  HPI Here for follow up of HIV.   On Genvoya and denies any missed doses. No associated weight loss, no diarrhea. Refuse all vaccines.  No labs prior to the visit.  No associated n/v.  No rash.  Last PAP 1 year ago.  Had a history of a hysterectomy in 2012 due to leiomyoma.  At the time she carried a diagnosis of leiomyoma, cervical dysplagia.   No mention of complete hysterectomy and cervix removal.    Review of Systems  Gastrointestinal: Negative for diarrhea and nausea.  Skin: Negative for rash.       Objective:   Physical Exam  Constitutional: She appears well-developed and well-nourished.  Eyes: No scleral icterus.  Cardiovascular: Normal rate, regular rhythm and normal heart sounds.  No murmur heard. Pulmonary/Chest: Effort normal and breath sounds normal. No respiratory distress.  Skin: No rash noted.    SH: previous tobacco      Assessment & Plan:

## 2017-08-14 LAB — URINE CYTOLOGY ANCILLARY ONLY
Chlamydia: NEGATIVE
NEISSERIA GONORRHEA: NEGATIVE

## 2017-08-14 LAB — T-HELPER CELL (CD4) - (RCID CLINIC ONLY)
CD4 % Helper T Cell: 37 % (ref 33–55)
CD4 T CELL ABS: 650 /uL (ref 400–2700)

## 2017-08-18 LAB — CBC WITH DIFFERENTIAL/PLATELET
Basophils Absolute: 37 cells/uL (ref 0–200)
Basophils Relative: 0.6 %
Eosinophils Absolute: 112 cells/uL (ref 15–500)
Eosinophils Relative: 1.8 %
HCT: 39.3 % (ref 35.0–45.0)
Hemoglobin: 13.1 g/dL (ref 11.7–15.5)
Lymphs Abs: 1804 cells/uL (ref 850–3900)
MCH: 28.6 pg (ref 27.0–33.0)
MCHC: 33.3 g/dL (ref 32.0–36.0)
MCV: 85.8 fL (ref 80.0–100.0)
MPV: 10.8 fL (ref 7.5–12.5)
Monocytes Relative: 6.6 %
NEUTROS PCT: 61.9 %
Neutro Abs: 3838 cells/uL (ref 1500–7800)
PLATELETS: 315 10*3/uL (ref 140–400)
RBC: 4.58 10*6/uL (ref 3.80–5.10)
RDW: 12 % (ref 11.0–15.0)
TOTAL LYMPHOCYTE: 29.1 %
WBC: 6.2 10*3/uL (ref 3.8–10.8)
WBCMIX: 409 {cells}/uL (ref 200–950)

## 2017-08-18 LAB — COMPLETE METABOLIC PANEL WITH GFR
AG Ratio: 1.4 (calc) (ref 1.0–2.5)
ALBUMIN MSPROF: 4.1 g/dL (ref 3.6–5.1)
ALKALINE PHOSPHATASE (APISO): 59 U/L (ref 33–115)
ALT: 14 U/L (ref 6–29)
AST: 15 U/L (ref 10–30)
BILIRUBIN TOTAL: 0.4 mg/dL (ref 0.2–1.2)
BUN / CREAT RATIO: 9 (calc) (ref 6–22)
BUN: 6 mg/dL — ABNORMAL LOW (ref 7–25)
CO2: 24 mmol/L (ref 20–32)
CREATININE: 0.66 mg/dL (ref 0.50–1.10)
Calcium: 10 mg/dL (ref 8.6–10.2)
Chloride: 106 mmol/L (ref 98–110)
GFR, Est African American: 128 mL/min/{1.73_m2} (ref >=60)
GFR, Est Non African American: 110 mL/min/{1.73_m2} (ref >=60)
Globulin: 2.9 g/dL (calc) (ref 1.9–3.7)
Glucose, Bld: 119 mg/dL — ABNORMAL HIGH (ref 65–99)
Potassium: 3.8 mmol/L (ref 3.5–5.3)
SODIUM: 138 mmol/L (ref 135–146)
Total Protein: 7 g/dL (ref 6.1–8.1)

## 2017-08-18 LAB — LIPID PANEL
Cholesterol: 215 mg/dL — ABNORMAL HIGH (ref <200)
HDL: 32 mg/dL — ABNORMAL LOW (ref >50)
LDL Cholesterol (Calc): 154 mg/dL (calc) — ABNORMAL HIGH
Non-HDL Cholesterol (Calc): 183 mg/dL (calc) — ABNORMAL HIGH (ref <130)
TRIGLYCERIDES: 159 mg/dL — AB (ref <150)
Total CHOL/HDL Ratio: 6.7 (calc) — ABNORMAL HIGH (ref <5.0)

## 2017-08-18 LAB — HIV-1 RNA QUANT-NO REFLEX-BLD
HIV 1 RNA Quant: <20 copies/mL
HIV-1 RNA Quant, Log: <1.3 Log copies/mL

## 2017-08-18 LAB — RPR: RPR Ser Ql: NONREACTIVE

## 2017-09-29 ENCOUNTER — Ambulatory Visit: Payer: Medicaid Other | Admitting: Infectious Diseases

## 2017-10-01 ENCOUNTER — Other Ambulatory Visit (HOSPITAL_COMMUNITY)
Admission: RE | Admit: 2017-10-01 | Discharge: 2017-10-01 | Disposition: A | Discharge status: Home / Self Care | Payer: Medicaid Other | Source: Ambulatory Visit | Attending: Infectious Diseases | Admitting: Infectious Diseases

## 2017-10-01 ENCOUNTER — Ambulatory Visit (INDEPENDENT_AMBULATORY_CARE_PROVIDER_SITE_OTHER): Payer: Medicaid Other | Admitting: Infectious Diseases

## 2017-10-01 ENCOUNTER — Encounter: Payer: Self-pay | Admitting: Infectious Diseases

## 2017-10-01 DIAGNOSIS — I10 Essential (primary) hypertension: Secondary | ICD-10-CM | POA: Diagnosis not present

## 2017-10-01 DIAGNOSIS — Z1231 Encounter for screening mammogram for malignant neoplasm of breast: Secondary | ICD-10-CM | POA: Diagnosis not present

## 2017-10-01 DIAGNOSIS — D649 Anemia, unspecified: Secondary | ICD-10-CM | POA: Diagnosis not present

## 2017-10-01 DIAGNOSIS — Z124 Encounter for screening for malignant neoplasm of cervix: Secondary | ICD-10-CM | POA: Diagnosis present

## 2017-10-01 DIAGNOSIS — E785 Hyperlipidemia, unspecified: Secondary | ICD-10-CM | POA: Insufficient documentation

## 2017-10-01 DIAGNOSIS — B20 Human immunodeficiency virus [HIV] disease: Secondary | ICD-10-CM | POA: Insufficient documentation

## 2017-10-01 DIAGNOSIS — Z1239 Encounter for other screening for malignant neoplasm of breast: Secondary | ICD-10-CM

## 2017-10-01 NOTE — Patient Instructions (Signed)
Will call or send a letter with results of your test today.   Please call the [facility] to set up your mammogram if you do not hear from them in 2 weeks.  [place], [address], [place] [address]

## 2017-10-01 NOTE — Progress Notes (Signed)
      Subjective:    Jacqueline Lowe is a 42 y.o. female here for an annual pelvic exam and pap smear. She is s/p hysterectomy with removal of her cervix. She does have a history of cervical dysplasia and underwent a LEEP procedure.   Review of Systems: Current GYN complaints or concerns: none.  Patient denies any abdominal/pelvic pain, problems with bowel movements, urination, vaginal discharge or intercourse.   Past Medical History:  Diagnosis Date  . Anemia   . Cervical cancer (HCC)    dx at age 13  . HIV (human immunodeficiency virus infection) ([place])    reported to be HIV+ by patient  . HLD (hyperlipidemia)   . HTN (hypertension)     Gynecologic History: No obstetric history on file.  Patient's last menstrual period was 10/02/2011. Contraception: status post hysterectomy Last Pap: 10/25/2015. Results were: normal (HPV neg) Anal Intercourse: no Last Mammogram: "had a mammogram a few years ago". Results were: normal  Objective:  Physical Exam  Constitutional: Well developed, well nourished, no acute distress. She is alert and oriented x3.  Pelvic: External genitalia is normal in appearance. The vagina is normal in appearance. The cervix is surgically absent.  Breasts: symmetrical in contour, shape and texture.  Psych: She has a normal mood and affect.    Assessment:  Normal pelvic with surgically absent organs. Thin prep pap was obtained and sent for cytology with reflex HPV and GC/C today. Normal clinical breast exam. Due for mammogram as she has a strong immediate family history (3 females) that have been treated for breast cancer.   Plan:  Health Maintenance =   With history of cervical dysplasia it is still recommended to perform vaginal pap smears. May be able to push them out to every 3 years per [facility] guidelines if she has had 3 consecutive negative tests.   Screening mammogram orders placed to be scheduled.   Contraception / Family Planning =   S/p  hysterectomy  HIV =   She will continue her Genvoya and F/U as scheduled with Dr. Linus Salmons for ongoing HIV care.   Janene Madeira, MSN, NP-C [facility] [place] [facility] Office: [phone] Pager: [phone]  10/01/17 8:47 AM

## 2017-10-05 LAB — CYTOLOGY - PAP
Chlamydia: NEGATIVE
Diagnosis: NEGATIVE
HPV: NOT DETECTED
Neisseria Gonorrhea: NEGATIVE

## 2017-10-05 NOTE — Progress Notes (Signed)
Vaginal sample negative for cancerous cells. Would recommend repeating in 1 year.  Please call [name] with the results and recommendations - thank you.

## 2017-10-06 ENCOUNTER — Telehealth: Payer: Self-pay | Admitting: Behavioral Health

## 2017-10-06 NOTE — Telephone Encounter (Signed)
-----   Message from Elko Callas, NP sent at 10/05/2017  4:11 PM EDT ----- Vaginal sample negative for cancerous cells. Would recommend repeating in 1 year.  Please call Ms. Balding with the results and recommendations - thank you.

## 2017-10-06 NOTE — Telephone Encounter (Signed)
Called patient, left a message for her to call back, gave office number.  Did not leave results or any information on the voicemail. Pricilla Riffle RN

## 2017-10-07 NOTE — Telephone Encounter (Signed)
Patient called back for results. RN relayed results, advice. She will schedule for next year. Landis Gandy, RN

## 2018-01-14 ENCOUNTER — Encounter: Payer: Self-pay | Admitting: Family Medicine

## 2018-01-14 ENCOUNTER — Ambulatory Visit (INDEPENDENT_AMBULATORY_CARE_PROVIDER_SITE_OTHER): Payer: Self-pay | Admitting: Family Medicine

## 2018-01-14 VITALS — BP 144/98 | HR 74 | Temp 99.0°F | Resp 16 | Ht 64.0 in | Wt 165.0 lb

## 2018-01-14 DIAGNOSIS — I10 Essential (primary) hypertension: Secondary | ICD-10-CM

## 2018-01-14 DIAGNOSIS — R7303 Prediabetes: Secondary | ICD-10-CM

## 2018-01-14 DIAGNOSIS — B20 Human immunodeficiency virus [HIV] disease: Secondary | ICD-10-CM

## 2018-01-14 DIAGNOSIS — E785 Hyperlipidemia, unspecified: Secondary | ICD-10-CM

## 2018-01-14 LAB — POCT GLYCOSYLATED HEMOGLOBIN (HGB A1C): Hemoglobin A1C: 5.4 % (ref 4.0–5.6)

## 2018-01-14 LAB — POCT URINALYSIS DIPSTICK
Bilirubin, UA: NEGATIVE
Blood, UA: NEGATIVE
Glucose, UA: NEGATIVE
Ketones, UA: NEGATIVE
Leukocytes, UA: NEGATIVE
Nitrite, UA: NEGATIVE
Protein, UA: NEGATIVE
Spec Grav, UA: 1.02 (ref 1.010–1.025)
Urobilinogen, UA: 0.2 E.U./dL
pH, UA: 6.5 (ref 5.0–8.0)

## 2018-01-14 MED ORDER — LOSARTAN POTASSIUM 50 MG PO TABS: 50.0000 mg | ORAL_TABLET | Freq: Every day | ORAL | 1 refills | Status: DC

## 2018-01-14 NOTE — Progress Notes (Signed)
[name]  New Patient Encounter Provider: Lanae Boast, [place]    DTO:[id number]  XIP:[id number]  DOB - August 22, 1975  SUBJECTIVE:   Jacqueline Lowe, is a 42 y.o. female who presents to establish care with this clinic.   has a past medical history of Anemia, Cervical cancer ([place]), HIV (human immunodeficiency virus infection) ([place]), HLD (hyperlipidemia), and HTN (hypertension).Current problems/concerns:   Patient with a hx of HTN. States that she is taking clonidine unsure of the dose at night before bed. Patient states that she has had htn for several years and was on norvasc in the past without relief.   No Known Allergies Past Medical History:  Diagnosis Date  . Anemia   . Cervical cancer (HCC)    dx at age 68  . HIV (human immunodeficiency virus infection) ([place])    reported to be HIV+ by patient  . HLD (hyperlipidemia)   . HTN (hypertension)    Current Outpatient Medications on File Prior to Visit  Medication Sig Dispense Refill  . cloNIDine (CATAPRES) 0.1 MG tablet Take 0.1 mg by mouth daily.    Marland Kitchen doxepin (SINEQUAN) 10 MG capsule Take 10 mg by mouth at bedtime as needed.    . elvitegravir-cobicistat-emtricitabine-tenofovir (GENVOYA) 150-150-200-10 MG TABS tablet Take 1 tablet by mouth daily. 30 tablet 11  . gabapentin (NEURONTIN) 300 MG capsule Take 600 mg by mouth 3 (three) times daily.     . meloxicam (MOBIC) 15 MG tablet Take 15 mg by mouth daily.    . pregabalin (LYRICA) 75 MG capsule Take 75 mg by mouth 2 (two) times daily.    . rizatriptan (MAXALT) 10 MG tablet Take 10 mg by mouth as needed for migraine. May repeat in 2 hours if needed    . Vitamin D, Ergocalciferol, (DRISDOL) 50000 units CAPS capsule Take 50,000 Units by mouth every 7 (seven) days.    Marland Kitchen zolpidem (AMBIEN) 10 MG tablet Take 10 mg by mouth at bedtime.     No current facility-administered medications on file prior to visit.    Family History  Problem  Relation Age of Onset  . Cervical cancer Mother   . Diabetes Mother   . Heart disease Mother   . Other Father        does not know father or pat history  . Breast cancer Maternal Aunt 40       x 2  . Colon polyps Maternal Aunt   . Diabetes Maternal Aunt        x 2  . Heart disease Maternal Aunt   . Kidney cancer Maternal Aunt    Social History   Socioeconomic History  . Marital status: Single    Spouse name: Not on file  . Number of children: 2  . Years of education: Not on file  . Highest education level: Not on file  Occupational History  . Not on file  Social Needs  . Financial resource strain: Not on file  . Food insecurity:    Worry: Not on file    Inability: Not on file  . Transportation needs:    Medical: Not on file    Non-medical: Not on file  Tobacco Use  . Smoking status: Former Smoker    Last attempt to quit: 04/28/1997    Years since quitting: 20.7  . Smokeless tobacco: Never Used  Substance and Sexual Activity  . Alcohol use: Yes    Alcohol/week: 0.0 standard drinks  Comment: socially  . Drug use: No  . Sexual activity: Yes    Comment: declined condoms  Lifestyle  . Physical activity:    Days per week: Not on file    Minutes per session: Not on file  . Stress: Not on file  Relationships  . Social connections:    Talks on phone: Not on file    Gets together: Not on file    Attends religious service: Not on file    Active member of club or organization: Not on file    Attends meetings of clubs or organizations: Not on file    Relationship status: Not on file  . Intimate partner violence:    Fear of current or ex partner: Not on file    Emotionally abused: Not on file    Physically abused: Not on file    Forced sexual activity: Not on file  Other Topics Concern  . Not on file  Social History Narrative  . Not on file    Review of Systems  Constitutional: Negative.   HENT: Negative.   Eyes: Negative.   Respiratory: Negative.     Cardiovascular: Negative.   Gastrointestinal: Negative.   Genitourinary: Negative.   Musculoskeletal: Negative.   Skin: Negative.   Neurological: Negative.   Psychiatric/Behavioral: Negative.      OBJECTIVE:    BP (!) 144/98 (BP Location: Left Arm, Patient Position: Sitting, Cuff Size: Normal)   Pulse 74   Temp 99 F (37.2 C) (Oral)   Resp 16   Ht 5\' 4"  (1.626 m)   Wt 165 lb (74.8 kg)   LMP 10/02/2011   SpO2 100%   BMI 28.32 kg/m   Physical Exam  Constitutional: She is oriented to person, place, and time and well-developed, well-nourished, and in no distress. No distress.  HENT:  Head: Normocephalic and atraumatic.  Eyes: Pupils are equal, round, and reactive to light. Conjunctivae and EOM are normal.  Neck: Normal range of motion. Neck supple.  Cardiovascular: Normal rate, regular rhythm and intact distal pulses. Exam reveals no gallop and no friction rub.  No murmur heard. Pulmonary/Chest: Effort normal and breath sounds normal. No respiratory distress. She has no wheezes.  Abdominal: Soft. Bowel sounds are normal. There is no tenderness.  Musculoskeletal: Normal range of motion. She exhibits no edema or tenderness.  Lymphadenopathy:    She has no cervical adenopathy.  Neurological: She is alert and oriented to person, place, and time. Gait normal.  Skin: Skin is warm and dry.  Psychiatric: Mood, memory, affect and judgment normal.  Nursing note and vitals reviewed.    ASSESSMENT/PLAN: 1. Essential hypertension Start on losartan 50 mg po qd. Return in 2 weeks for BP check.  - Urinalysis Dipstick - losartan (COZAAR) 50 MG tablet; Take 1 tablet (50 mg total) by mouth daily.  Dispense: 90 tablet; Refill: 1 - CBC With Differential - Comprehensive metabolic panel - Microalbumin/Creatinine Ratio, Urine  2. Prediabetes A1c today - Microalbumin/Creatinine Ratio, Urine - HgB A1c  3. Hyperlipidemia, unspecified hyperlipidemia type Labs ordered.  - Lipid  Panel  4. Human immunodeficiency virus disease ([place])  continue present plan and medications.     Return in about 3 months (around 04/15/2018), or 2 weeks for BP check nurse only.  The patient was given clear instructions to go to ER or return to medical center if symptoms don't improve, worsen or new problems develop. The patient verbalized understanding. The patient was told to call to get lab results if  they haven't heard anything in the next week.     This note has been created with Surveyor, quantity. Any transcriptional errors are unintentional.   Ms. Andr L. Nathaneil Canary, FNP-BC [facility] [address] [place], [place] [address] [phone]

## 2018-01-14 NOTE — Patient Instructions (Signed)
2 weeks for BP check with the nurse only.  I will see you in 3 months.  I look forward to working with you.    DASH Eating Plan DASH stands for "Dietary Approaches to Stop Hypertension." The DASH eating plan is a healthy eating plan that has been shown to reduce high blood pressure (hypertension). It may also reduce your risk for type 2 diabetes, heart disease, and stroke. The DASH eating plan may also help with weight loss. What are tips for following this plan? General guidelines  Avoid eating more than 2,300 mg (milligrams) of salt (sodium) a day. If you have hypertension, you may need to reduce your sodium intake to 1,500 mg a day.  Limit alcohol intake to no more than 1 drink a day for nonpregnant women and 2 drinks a day for men. One drink equals 12 oz of beer, 5 oz of wine, or 1 oz of hard liquor.  Work with your health care provider to maintain a healthy body weight or to lose weight. Ask what an ideal weight is for you.  Get at least 30 minutes of exercise that causes your heart to beat faster (aerobic exercise) most days of the week. Activities may include walking, swimming, or biking.  Work with your health care provider or diet and nutrition specialist (dietitian) to adjust your eating plan to your individual calorie needs. Reading food labels  Check food labels for the amount of sodium per serving. Choose foods with less than 5 percent of the Daily Value of sodium. Generally, foods with less than 300 mg of sodium per serving fit into this eating plan.  To find whole grains, look for the word "whole" as the first word in the ingredient list. Shopping  Buy products labeled as "low-sodium" or "no salt added."  Buy fresh foods. Avoid canned foods and premade or frozen meals. Cooking  Avoid adding salt when cooking. Use salt-free seasonings or herbs instead of table salt or sea salt. Check with your health care provider or pharmacist before using salt substitutes.  Do not  fry foods. Cook foods using healthy methods such as baking, boiling, grilling, and broiling instead.  Cook with heart-healthy oils, such as olive, canola, soybean, or sunflower oil. Meal planning   Eat a balanced diet that includes: ? 5 or more servings of fruits and vegetables each day. At each meal, try to fill half of your plate with fruits and vegetables. ? Up to 6-8 servings of whole grains each day. ? Less than 6 oz of lean meat, poultry, or fish each day. A 3-oz serving of meat is about the same size as a deck of cards. One egg equals 1 oz. ? 2 servings of low-fat dairy each day. ? A serving of nuts, seeds, or beans 5 times each week. ? Heart-healthy fats. Healthy fats called Omega-3 fatty acids are found in foods such as flaxseeds and coldwater fish, like sardines, salmon, and mackerel.  Limit how much you eat of the following: ? Canned or prepackaged foods. ? Food that is high in trans fat, such as fried foods. ? Food that is high in saturated fat, such as fatty meat. ? Sweets, desserts, sugary drinks, and other foods with added sugar. ? Full-fat dairy products.  Do not salt foods before eating.  Try to eat at least 2 vegetarian meals each week.  Eat more home-cooked food and less restaurant, buffet, and fast food.  When eating at a restaurant, ask that your food be  prepared with less salt or no salt, if possible. What foods are recommended? The items listed may not be a complete list. Talk with your dietitian about what dietary choices are best for you. Grains Whole-grain or whole-wheat bread. Whole-grain or whole-wheat pasta. Brown rice. [place]. Bulgur. Whole-grain and low-sodium cereals. Pita bread. Low-fat, low-sodium crackers. Whole-wheat flour tortillas. Vegetables Fresh or frozen vegetables (raw, steamed, roasted, or grilled). Low-sodium or reduced-sodium tomato and vegetable juice. Low-sodium or reduced-sodium tomato sauce and tomato paste. Low-sodium or  reduced-sodium canned vegetables. Fruits All fresh, dried, or frozen fruit. Canned fruit in natural juice (without added sugar). Meat and other protein foods Skinless chicken or [place]. Ground chicken or [place]. Pork with fat trimmed off. Fish and seafood. Egg whites. Dried beans, peas, or lentils. Unsalted nuts, nut butters, and seeds. Unsalted canned beans. Lean cuts of beef with fat trimmed off. Low-sodium, lean deli meat. Dairy Low-fat (1%) or fat-free (skim) milk. Fat-free, low-fat, or reduced-fat cheeses. Nonfat, low-sodium ricotta or cottage cheese. Low-fat or nonfat yogurt. Low-fat, low-sodium cheese. Fats and oils Soft margarine without trans fats. Vegetable oil. Low-fat, reduced-fat, or light mayonnaise and salad dressings (reduced-sodium). Canola, safflower, olive, soybean, and sunflower oils. Avocado. Seasoning and other foods Herbs. Spices. Seasoning mixes without salt. Unsalted popcorn and pretzels. Fat-free sweets. What foods are not recommended? The items listed may not be a complete list. Talk with your dietitian about what dietary choices are best for you. Grains Baked goods made with fat, such as croissants, muffins, or some breads. Dry pasta or rice meal packs. Vegetables Creamed or fried vegetables. Vegetables in a cheese sauce. Regular canned vegetables (not low-sodium or reduced-sodium). Regular canned tomato sauce and paste (not low-sodium or reduced-sodium). Regular tomato and vegetable juice (not low-sodium or reduced-sodium). Angie Fava. Olives. Fruits Canned fruit in a light or heavy syrup. Fried fruit. Fruit in cream or butter sauce. Meat and other protein foods Fatty cuts of meat. Ribs. Fried meat. Berniece Salines. Sausage. Bologna and other processed lunch meats. Salami. Fatback. Hotdogs. Bratwurst. Salted nuts and seeds. Canned beans with added salt. Canned or smoked fish. Whole eggs or egg yolks. Chicken or [place] with skin. Dairy Whole or 2% milk, cream, and half-and-half.  Whole or full-fat cream cheese. Whole-fat or sweetened yogurt. Full-fat cheese. Nondairy creamers. Whipped toppings. Processed cheese and cheese spreads. Fats and oils Butter. Stick margarine. Lard. Shortening. Ghee. Bacon fat. Tropical oils, such as coconut, palm kernel, or palm oil. Seasoning and other foods Salted popcorn and pretzels. Onion salt, garlic salt, seasoned salt, table salt, and sea salt. Worcestershire sauce. Tartar sauce. Barbecue sauce. Teriyaki sauce. Soy sauce, including reduced-sodium. Steak sauce. Canned and packaged gravies. Fish sauce. Oyster sauce. Cocktail sauce. Horseradish that you find on the shelf. Ketchup. Mustard. Meat flavorings and tenderizers. Bouillon cubes. Hot sauce and Tabasco sauce. Premade or packaged marinades. Premade or packaged taco seasonings. Relishes. Regular salad dressings. Where to find more information:  National Heart, Lung, and [place]: https://wilson-eaton.com/  American Heart Association: www.heart.org Summary  The DASH eating plan is a healthy eating plan that has been shown to reduce high blood pressure (hypertension). It may also reduce your risk for type 2 diabetes, heart disease, and stroke.  With the DASH eating plan, you should limit salt (sodium) intake to 2,300 mg a day. If you have hypertension, you may need to reduce your sodium intake to 1,500 mg a day.  When on the DASH eating plan, aim to eat more fresh fruits and vegetables, whole grains,  lean proteins, low-fat dairy, and heart-healthy fats.  Work with your health care provider or diet and nutrition specialist (dietitian) to adjust your eating plan to your individual calorie needs. This information is not intended to replace advice given to you by your health care provider. Make sure you discuss any questions you have with your health care provider. Document Released: 04/03/2011 Document Revised: 04/07/2016 Document Reviewed: 04/07/2016 Elsevier Interactive Patient Education   2018 [employer]. Losartan tablets What is this medicine? LOSARTAN (loe SAR tan) is used to treat high blood pressure and to reduce the risk of stroke in certain patients. This drug also slows the progression of kidney disease in patients with diabetes. This medicine may be used for other purposes; ask your health care provider or pharmacist if you have questions. COMMON BRAND NAME(S): Cozaar What should I tell my health care provider before I take this medicine? They need to know if you have any of these conditions: -heart failure -kidney or liver disease -an unusual or allergic reaction to losartan, other medicines, foods, dyes, or preservatives -pregnant or trying to get pregnant -breast-feeding How should I use this medicine? Take this medicine by mouth with a glass of water. Follow the directions on the prescription label. This medicine can be taken with or without food. Take your doses at regular intervals. Do not take your medicine more often than directed. Talk to your pediatrician regarding the use of this medicine in children. Special care may be needed. Overdosage: If you think you have taken too much of this medicine contact a poison control center or emergency room at once. NOTE: This medicine is only for you. Do not share this medicine with others. What if I miss a dose? If you miss a dose, take it as soon as you can. If it is almost time for your next dose, take only that dose. Do not take double or extra doses. What may interact with this medicine? -blood pressure medicines -diuretics, especially triamterene, spironolactone, or amiloride -fluconazole -NSAIDs, medicines for pain and inflammation, like ibuprofen or naproxen -potassium salts or potassium supplements -rifampin This list may not describe all possible interactions. Give your health care provider a list of all the medicines, herbs, non-prescription drugs, or dietary supplements you use. Also tell them if you  smoke, drink alcohol, or use illegal drugs. Some items may interact with your medicine. What should I watch for while using this medicine? Visit your doctor or health care professional for regular checks on your progress. Check your blood pressure as directed. Ask your doctor or health care professional what your blood pressure should be and when you should contact him or her. Call your doctor or health care professional if you notice an irregular or fast heart beat. Women should inform their doctor if they wish to become pregnant or think they might be pregnant. There is a potential for serious side effects to an unborn child, particularly in the second or third trimester. Talk to your health care professional or pharmacist for more information. You may get drowsy or dizzy. Do not drive, use machinery, or do anything that needs mental alertness until you know how this drug affects you. Do not stand or sit up quickly, especially if you are an older patient. This reduces the risk of dizzy or fainting spells. Alcohol can make you more drowsy and dizzy. Avoid alcoholic drinks. Avoid salt substitutes unless you are told otherwise by your doctor or health care professional. Do not treat yourself for coughs,  colds, or pain while you are taking this medicine without asking your doctor or health care professional for advice. Some ingredients may increase your blood pressure. What side effects may I notice from receiving this medicine? Side effects that you should report to your doctor or health care professional as soon as possible: -confusion, dizziness, light headedness or fainting spells -decreased amount of urine passed -difficulty breathing or swallowing, hoarseness, or tightening of the throat -fast or irregular heart beat, palpitations, or chest pain -skin rash, itching -swelling of your face, lips, tongue, hands, or feet Side effects that usually do not require medical attention (report to your doctor  or health care professional if they continue or are bothersome): -cough -decreased sexual function or desire -headache -nasal congestion or stuffiness -nausea or stomach pain -sore or cramping muscles This list may not describe all possible side effects. Call your doctor for medical advice about side effects. You may report side effects to FDA at 1-800-FDA-1088. Where should I keep my medicine? Keep out of the reach of children. Store at room temperature between 15 and 30 degrees C (59 and 86 degrees F). Protect from light. Keep container tightly closed. Throw away any unused medicine after the expiration date. NOTE: This sheet is a summary. It may not cover all possible information. If you have questions about this medicine, talk to your doctor, pharmacist, or health care provider.  2018 Elsevier/Gold Standard (2007-06-25 16:42:18)

## 2018-01-15 LAB — COMPREHENSIVE METABOLIC PANEL
ALT: 8 IU/L (ref 0–32)
AST: 12 IU/L (ref 0–40)
Albumin/Globulin Ratio: 1.4 (ref 1.2–2.2)
Albumin: 4.5 g/dL (ref 3.5–5.5)
Alkaline Phosphatase: 69 IU/L (ref 39–117)
BUN/Creatinine Ratio: 9 (ref 9–23)
BUN: 6 mg/dL (ref 6–24)
Bilirubin Total: 0.4 mg/dL (ref 0.0–1.2)
CO2: 23 mmol/L (ref 20–29)
Calcium: 10.4 mg/dL — ABNORMAL HIGH (ref 8.7–10.2)
Chloride: 103 mmol/L (ref 96–106)
Creatinine, Ser: 0.64 mg/dL (ref 0.57–1.00)
GFR calc Af Amer: 128 mL/min/{1.73_m2} (ref >59)
GFR calc non Af Amer: 111 mL/min/{1.73_m2} (ref >59)
Globulin, Total: 3.2 g/dL (ref 1.5–4.5)
Glucose: 104 mg/dL — ABNORMAL HIGH (ref 65–99)
Potassium: 4 mmol/L (ref 3.5–5.2)
Sodium: 141 mmol/L (ref 134–144)
Total Protein: 7.7 g/dL (ref 6.0–8.5)

## 2018-01-15 LAB — CBC WITH DIFFERENTIAL
Basophils Absolute: 0 10*3/uL (ref 0.0–0.2)
Basos: 1 %
EOS (ABSOLUTE): 0.1 10*3/uL (ref 0.0–0.4)
Eos: 1 %
Hematocrit: 42.9 % (ref 34.0–46.6)
Hemoglobin: 14.5 g/dL (ref 11.1–15.9)
Immature Grans (Abs): 0 10*3/uL (ref 0.0–0.1)
Immature Granulocytes: 0 %
Lymphocytes Absolute: 1.8 10*3/uL (ref 0.7–3.1)
Lymphs: 31 %
MCH: 29.5 pg (ref 26.6–33.0)
MCHC: 33.8 g/dL (ref 31.5–35.7)
MCV: 87 fL (ref 79–97)
Monocytes Absolute: 0.4 10*3/uL (ref 0.1–0.9)
Monocytes: 7 %
Neutrophils Absolute: 3.4 10*3/uL (ref 1.4–7.0)
Neutrophils: 60 %
RBC: 4.91 x10E6/uL (ref 3.77–5.28)
RDW: 12.7 % (ref 12.3–15.4)
WBC: 5.7 10*3/uL (ref 3.4–10.8)

## 2018-01-15 LAB — LIPID PANEL
Chol/HDL Ratio: 8 ratio — ABNORMAL HIGH (ref 0.0–4.4)
Cholesterol, Total: 265 mg/dL — ABNORMAL HIGH (ref 100–199)
HDL: 33 mg/dL — ABNORMAL LOW (ref >39)
LDL Calculated: 196 mg/dL — ABNORMAL HIGH (ref 0–99)
Triglycerides: 182 mg/dL — ABNORMAL HIGH (ref 0–149)
VLDL Cholesterol Cal: 36 mg/dL (ref 5–40)

## 2018-01-15 LAB — MICROALBUMIN / CREATININE URINE RATIO
Creatinine, Urine: 143.6 mg/dL
Microalb/Creat Ratio: 28.5 mg/g creat (ref 0.0–30.0)
Microalbumin, Urine: 40.9 ug/mL

## 2018-01-21 ENCOUNTER — Ambulatory Visit: Payer: Self-pay

## 2018-01-21 ENCOUNTER — Encounter: Payer: Self-pay | Admitting: Internal Medicine

## 2018-02-23 ENCOUNTER — Other Ambulatory Visit: Payer: Self-pay | Admitting: Internal Medicine

## 2018-02-23 ENCOUNTER — Other Ambulatory Visit: Payer: Self-pay | Admitting: Behavioral Health

## 2018-02-23 DIAGNOSIS — B20 Human immunodeficiency virus [HIV] disease: Secondary | ICD-10-CM

## 2018-02-23 MED ORDER — ELVITEG-COBIC-EMTRICIT-TENOFAF 150-150-200-10 MG PO TABS: 1.0000 | ORAL_TABLET | Freq: Every day | ORAL | 3 refills | Status: DC

## 2018-04-15 ENCOUNTER — Ambulatory Visit: Payer: Self-pay | Admitting: Family Medicine

## 2018-06-17 ENCOUNTER — Other Ambulatory Visit: Payer: Self-pay | Admitting: Internal Medicine

## 2018-06-17 DIAGNOSIS — B20 Human immunodeficiency virus [HIV] disease: Secondary | ICD-10-CM

## 2018-08-03 ENCOUNTER — Other Ambulatory Visit: Payer: Self-pay | Admitting: Internal Medicine

## 2018-08-03 DIAGNOSIS — B20 Human immunodeficiency virus [HIV] disease: Secondary | ICD-10-CM

## 2018-08-18 ENCOUNTER — Other Ambulatory Visit: Payer: Self-pay | Admitting: Internal Medicine

## 2018-08-18 ENCOUNTER — Ambulatory Visit (INDEPENDENT_AMBULATORY_CARE_PROVIDER_SITE_OTHER): Payer: Self-pay | Admitting: Internal Medicine

## 2018-08-18 ENCOUNTER — Other Ambulatory Visit: Payer: Self-pay

## 2018-08-18 ENCOUNTER — Encounter: Payer: Self-pay | Admitting: Infectious Diseases

## 2018-08-18 ENCOUNTER — Encounter: Payer: Self-pay | Admitting: Internal Medicine

## 2018-08-18 VITALS — BP 178/131 | HR 101 | Temp 97.6°F | Wt 177.0 lb

## 2018-08-18 DIAGNOSIS — B20 Human immunodeficiency virus [HIV] disease: Secondary | ICD-10-CM

## 2018-08-18 DIAGNOSIS — I10 Essential (primary) hypertension: Secondary | ICD-10-CM

## 2018-08-18 DIAGNOSIS — Z7189 Other specified counseling: Secondary | ICD-10-CM

## 2018-08-18 DIAGNOSIS — Z79899 Other long term (current) drug therapy: Secondary | ICD-10-CM

## 2018-08-18 DIAGNOSIS — Z7185 Encounter for immunization safety counseling: Secondary | ICD-10-CM

## 2018-08-18 DIAGNOSIS — Z113 Encounter for screening for infections with a predominantly sexual mode of transmission: Secondary | ICD-10-CM

## 2018-08-18 MED ORDER — AMLODIPINE BESYLATE 5 MG PO TABS: 5.0000 mg | ORAL_TABLET | Freq: Every day | ORAL | 11 refills | Status: DC

## 2018-08-18 NOTE — Assessment & Plan Note (Addendum)
Will offer Prevnar next visit.  Also may be good candidate for HPV

## 2018-08-18 NOTE — Assessment & Plan Note (Signed)
Will screen today 

## 2018-08-18 NOTE — Assessment & Plan Note (Signed)
Will check lipid panel. 

## 2018-08-18 NOTE — Assessment & Plan Note (Signed)
Poorly controlled.  I will start Norvasc which she can get with HMAP.  She will check her BP at home and in two weeks give a log over mychart or phone and will consider increasing her Norvasc to 10 mg if indicated.  May also consider HCTZ in addition.   She will have a PAP appt in 2 months and if her blood pressure can again be checked then and monitored, medication added if indicated.  I can then see her 4 months later.

## 2018-08-18 NOTE — Progress Notes (Signed)
   Subjective:    Patient ID: Jacqueline Lowe, adult    DOB: 1975-06-08, 43 y.o.   MRN: [id number]  HPI Here for follow up of HIV Yearly follow up but has since lost her insurance.  Now on HMAP.  Has not been able to see her PCP due to cost.  Not on BP meds due to cost.  Takes her Genvoya daily with no missed doses.  No complaints with that.  Pap done last June and recommended yearly follow up.  Has some headaches daily.  Has bp machine at home.  No new issues otherwise.    Review of Systems  Constitutional: Negative for fatigue.  Gastrointestinal: Negative for diarrhea and nausea.  Skin: Negative for rash.  Neurological: Positive for headaches. Negative for dizziness and light-headedness.       Objective:   Physical Exam Constitutional:      Appearance: Normal appearance.  HENT:     Mouth/Throat:     Pharynx: No posterior oropharyngeal erythema.  Eyes:     General: No scleral icterus. Cardiovascular:     Rate and Rhythm: Regular rhythm. Tachycardia present.     Heart sounds: No murmur.  Pulmonary:     Effort: Pulmonary effort is normal.     Breath sounds: Normal breath sounds.  Skin:    Findings: No rash.  Neurological:     Mental Status: He is alert.   SH: no tobacco        Assessment & Plan:

## 2018-08-19 LAB — T-HELPER CELL (CD4) - (RCID CLINIC ONLY)
CD4 % Helper T Cell: 39 % (ref 33–55)
CD4 T Cell Abs: 870 /uL (ref 400–2700)

## 2018-08-19 LAB — URINE CYTOLOGY ANCILLARY ONLY
Chlamydia: NEGATIVE
Neisseria Gonorrhea: NEGATIVE

## 2018-08-21 LAB — COMPLETE METABOLIC PANEL WITH GFR
AG Ratio: 1.7 (calc) (ref 1.0–2.5)
ALT: 15 U/L (ref 6–29)
AST: 15 U/L (ref 10–30)
Albumin: 4.5 g/dL (ref 3.6–5.1)
Alkaline phosphatase (APISO): 56 U/L (ref 31–125)
BUN: 10 mg/dL (ref 7–25)
CO2: 26 mmol/L (ref 20–32)
Calcium: 10.5 mg/dL — ABNORMAL HIGH (ref 8.6–10.2)
Chloride: 106 mmol/L (ref 98–110)
Creat: 0.7 mg/dL (ref 0.50–1.10)
GFR, Est African American: 125 mL/min/{1.73_m2} (ref >=60)
GFR, Est Non African American: 108 mL/min/{1.73_m2} (ref >=60)
Globulin: 2.7 g/dL (calc) (ref 1.9–3.7)
Glucose, Bld: 126 mg/dL — ABNORMAL HIGH (ref 65–99)
Potassium: 3.7 mmol/L (ref 3.5–5.3)
Sodium: 141 mmol/L (ref 135–146)
Total Bilirubin: 0.5 mg/dL (ref 0.2–1.2)
Total Protein: 7.2 g/dL (ref 6.1–8.1)

## 2018-08-21 LAB — CBC WITH DIFFERENTIAL/PLATELET
Absolute Monocytes: 403 cells/uL (ref 200–950)
Basophils Absolute: 39 cells/uL (ref 0–200)
Basophils Relative: 0.7 %
Eosinophils Absolute: 129 cells/uL (ref 15–500)
Eosinophils Relative: 2.3 %
HCT: 40.9 % (ref 35.0–45.0)
Hemoglobin: 13.8 g/dL (ref 11.7–15.5)
Lymphs Abs: 2313 cells/uL (ref 850–3900)
MCH: 29.4 pg (ref 27.0–33.0)
MCHC: 33.7 g/dL (ref 32.0–36.0)
MCV: 87 fL (ref 80.0–100.0)
MPV: 10.8 fL (ref 7.5–12.5)
Monocytes Relative: 7.2 %
Neutro Abs: 2716 cells/uL (ref 1500–7800)
Neutrophils Relative %: 48.5 %
Platelets: 382 10*3/uL (ref 140–400)
RBC: 4.7 10*6/uL (ref 3.80–5.10)
RDW: 12.3 % (ref 11.0–15.0)
Total Lymphocyte: 41.3 %
WBC: 5.6 10*3/uL (ref 3.8–10.8)

## 2018-08-21 LAB — HIV-1 RNA QUANT-NO REFLEX-BLD
HIV 1 RNA Quant: <20 copies/mL
HIV-1 RNA Quant, Log: <1.3 Log copies/mL

## 2018-08-21 LAB — LIPID PANEL
Cholesterol: 240 mg/dL — ABNORMAL HIGH (ref <200)
HDL: 33 mg/dL — ABNORMAL LOW (ref >=50)
LDL Cholesterol (Calc): 166 mg/dL (calc) — ABNORMAL HIGH
Non-HDL Cholesterol (Calc): 207 mg/dL (calc) — ABNORMAL HIGH (ref <130)
Total CHOL/HDL Ratio: 7.3 (calc) — ABNORMAL HIGH (ref <5.0)
Triglycerides: 250 mg/dL — ABNORMAL HIGH (ref <150)

## 2018-08-21 LAB — RPR: RPR Ser Ql: NONREACTIVE

## 2018-09-24 ENCOUNTER — Other Ambulatory Visit: Payer: Self-pay | Admitting: Internal Medicine

## 2018-09-24 DIAGNOSIS — B20 Human immunodeficiency virus [HIV] disease: Secondary | ICD-10-CM

## 2018-10-27 ENCOUNTER — Ambulatory Visit: Payer: Self-pay

## 2018-10-27 ENCOUNTER — Ambulatory Visit (INDEPENDENT_AMBULATORY_CARE_PROVIDER_SITE_OTHER): Payer: Self-pay | Admitting: Internal Medicine

## 2018-10-27 ENCOUNTER — Encounter: Payer: Self-pay | Admitting: Internal Medicine

## 2018-10-27 ENCOUNTER — Other Ambulatory Visit: Payer: Self-pay

## 2018-10-27 VITALS — BP 151/92 | HR 97 | Temp 98.7°F | Wt 174.0 lb

## 2018-10-27 DIAGNOSIS — Z7189 Other specified counseling: Secondary | ICD-10-CM

## 2018-10-27 DIAGNOSIS — Z7185 Encounter for immunization safety counseling: Secondary | ICD-10-CM

## 2018-10-27 DIAGNOSIS — F5101 Primary insomnia: Secondary | ICD-10-CM

## 2018-10-27 DIAGNOSIS — I10 Essential (primary) hypertension: Secondary | ICD-10-CM

## 2018-10-27 DIAGNOSIS — Z0000 Encounter for general adult medical examination without abnormal findings: Secondary | ICD-10-CM

## 2018-10-27 MED ORDER — AMLODIPINE BESYLATE 10 MG PO TABS: 10.0000 mg | ORAL_TABLET | Freq: Every day | ORAL | 11 refills | Status: DC

## 2018-10-27 NOTE — Assessment & Plan Note (Signed)
Will get her scheduled for a PAP, may need mammogram as well with [facility]

## 2018-10-27 NOTE — Assessment & Plan Note (Signed)
Discussed sleep hygiene

## 2018-10-27 NOTE — Assessment & Plan Note (Signed)
Log reviewed and still mainly elevated.  I will increase Norvasc to 10 mg.   rtc 6 weeks for this

## 2018-10-27 NOTE — Assessment & Plan Note (Signed)
Continues to refuse all vaccines.

## 2018-10-27 NOTE — Progress Notes (Signed)
   Subjective:    Patient ID: Jacqueline Lowe, adult    DOB: 09-04-1975, 43 y.o.   MRN: [id number]  HPI Here for follow up of HTN. She previously had been on BP medications but lost her insurance.  Now on HMAP and I started her on Norvasc 5mg .  BP log provided and mainly still elevated but some near normal.  Headache better.  Poor sleep.  Hard to fall asleep and wakes up early.     Review of Systems  Constitutional: Negative for fatigue.  Gastrointestinal: Negative for diarrhea.  Skin: Negative for rash.  Neurological: Positive for headaches. Negative for dizziness.       Occasional headaches but improved       Objective:   Physical Exam Constitutional:      Appearance: Normal appearance.  Eyes:     General: No scleral icterus. Cardiovascular:     Rate and Rhythm: Normal rate and regular rhythm.  Skin:    Findings: No rash.  Neurological:     Mental Status: He is alert.           Assessment & Plan:

## 2018-11-17 ENCOUNTER — Telehealth: Payer: Self-pay | Admitting: Infectious Diseases

## 2018-11-17 NOTE — Telephone Encounter (Signed)
COVID-19 Pre-Screening Questions:11/17/18  Do you currently have a fever (>100 F), chills or unexplained body aches?NO  Are you currently experiencing new cough, shortness of breath, sore throat, runny nose?NO   Have you recently travelled outside the state of [place] in the last 14 days? NO   Have you been in contact with someone that is currently pending confirmation of Covid19 testing or has been confirmed to have the [place] virus?  NO  **If the patient answers NO to ALL questions -  advise the patient to please call the clinic before coming to the office should any symptoms develop.

## 2018-11-18 ENCOUNTER — Ambulatory Visit (INDEPENDENT_AMBULATORY_CARE_PROVIDER_SITE_OTHER): Payer: Self-pay | Admitting: Infectious Diseases

## 2018-11-18 ENCOUNTER — Other Ambulatory Visit: Payer: Self-pay

## 2018-11-18 ENCOUNTER — Encounter: Payer: Self-pay | Admitting: Infectious Diseases

## 2018-11-18 VITALS — BP 147/86 | HR 98 | Temp 99.4°F

## 2018-11-18 DIAGNOSIS — Z113 Encounter for screening for infections with a predominantly sexual mode of transmission: Secondary | ICD-10-CM

## 2018-11-18 DIAGNOSIS — Z803 Family history of malignant neoplasm of breast: Secondary | ICD-10-CM

## 2018-11-18 DIAGNOSIS — K5900 Constipation, unspecified: Secondary | ICD-10-CM

## 2018-11-18 DIAGNOSIS — Z124 Encounter for screening for malignant neoplasm of cervix: Secondary | ICD-10-CM

## 2018-11-18 NOTE — Addendum Note (Signed)
Addended by: Dolan Amen D on: 11/18/2018 11:54 AM   Modules accepted: Orders

## 2018-11-18 NOTE — Progress Notes (Signed)
      Subjective:    Jacqueline Lowe is a 43 y.o. female here for an annual pelvic exam and pap smear.   Review of Systems: Current GYN complaints or concerns: Abdominal pain.  Patient denies any abdominal/pelvic pain, problems with bowel movements, urination, vaginal discharge or intercourse.   She struggles with constipation chronically.  Previously was taking Metamucil once daily with good effect however now she is only able to pass bowel movements every 3 to 4 days.  She describes straining and difficulty passing.  Past Medical History:  Diagnosis Date  . Anemia   . Cervical cancer (HCC)    dx at age 58  . HIV (human immunodeficiency virus infection) ([place])    reported to be HIV+ by patient  . HLD (hyperlipidemia)   . HTN (hypertension)     Gynecologic History: No obstetric history on file.  Patient's last menstrual period was 10/02/2011. Contraception: status post hysterectomy Last Pap: 09/2017. Results were: normal Anal Intercourse: no Last Mammogram: to be scheduled.  Objective:  Physical Exam  Constitutional: Well developed, well nourished, no acute distress. She is alert and oriented x3.  Pelvic: External genitalia is normal in appearance. The vagina is normal in appearance. The cervix is bulbous and easily visualized. No CMT, normal expected cervical mucus present. Bimanual exam reveals uterus that is felt to be normal size, shape, and contour. No adnexal masses or tenderness noted. Breasts: symmetrical in contour, shape and texture. No palpable masses/nodules. No nipple discharge.  Psych: She has a normal mood and affect.    Assessment:  Normal pelvic and bimanual exam. Thin prep pap was obtained and sent for cytology with reflex HPV and GC/C today. Normal clinical breast exam.   Plan:  Health Maintenance =   History of cervical cancer per her report (s/p LEEP but says hysterectomy was done d/t bleeding and severe anemia). Would continue lifelong screenings at  maximal interval q7yr if she has another negative cytology and hpv.   Results will be communicated to the patient via phone call  She has been counseled and instructed how to perform monthly self breast exams.  Family history of breast cancer =  We discussed current recommendations regarding initiating screening at the age of 56.  She has had 2 mammograms thus far and is not quite sure she wants to continue with annual screenings at the moment.  Her mother and aunt both were treated for breast cancer.  Orders are in the computer and information provided to schedule appointment.  She will need mammography scholarship find assistance and knows to call if she decides to proceed now.  Contraception / Family Planning =   S/p hysterectomy   Constipation =  Recommended adding back Metamucil over-the-counter.  Suggested to start slowly twice a week and increase to daily to avoid side effects related to gas production.  Increase water intake  And exercise daily to encourage motility  Information discussed regarding high-fiber food options  HIV =   She will continue her Genvoya and F/U as scheduled with Dr. Linus Salmons for ongoing HIV care.   Janene Madeira, MSN, NP-C [facility] [place] [facility] Office: [phone] Pager: [phone]  11/18/18 9:53 AM

## 2018-11-18 NOTE — Patient Instructions (Addendum)
Nice to see you today.   Your results will be called to you. If your vaginal pap returns normal this time we can consider spacing out your screenings to maximum every 3 years.   No changes in any medications today.   Please follow up with Dr. Linus Salmons as you are currently scheduled.   Please resume taking your Metamucil (can get the generic brand from [employer], even make it flavored). During the week. Slowly add back 2x a week then increase to every day as you can tolerate (sometimes can cause some gas while you get used to taking it again).   To help combat constipation also would recommend increasing your water intake, consider adding some exercise (yoga, stretching) to your day to help.    For your mammogram - would suggest with your mom and aunt having breast cancer would start screenings with yearly mammograms now. We can help you get this paid for through a scholarship fund if you decide to start these screenings now.   Please call the [facility] to set up your mammogram.  [phone] [address], [place], [place] [address]

## 2018-11-22 LAB — CYTOLOGY - PAP
Chlamydia: NEGATIVE
Diagnosis: NEGATIVE
HPV: NOT DETECTED
Neisseria Gonorrhea: NEGATIVE

## 2018-11-26 ENCOUNTER — Telehealth: Payer: Self-pay

## 2018-11-26 NOTE — Telephone Encounter (Signed)
-----   Message from [place] Callas, NP sent at 11/25/2018  7:16 PM EDT ----- Please call Jacqueline Lowe to let her know that her pap smear was negative for any abnormal vaginal cells and negative for HPV.  She has had more than 3 consecutive normal tests and I would recommend she repeat again in 3 years (OK to continue yearly screening with her if she prefers).

## 2018-11-26 NOTE — Telephone Encounter (Signed)
Left HIPPA compliant voicemail about  results.  Eugenia Mcalpine, LPN

## 2018-12-09 ENCOUNTER — Ambulatory Visit: Payer: Self-pay | Admitting: Internal Medicine

## 2019-01-05 ENCOUNTER — Encounter (HOSPITAL_COMMUNITY): Payer: Self-pay

## 2019-01-05 ENCOUNTER — Encounter (HOSPITAL_COMMUNITY): Payer: Self-pay | Admitting: *Deleted

## 2019-01-13 ENCOUNTER — Other Ambulatory Visit: Payer: Self-pay | Admitting: Internal Medicine

## 2019-01-13 DIAGNOSIS — B20 Human immunodeficiency virus [HIV] disease: Secondary | ICD-10-CM

## 2019-06-29 ENCOUNTER — Other Ambulatory Visit: Payer: Self-pay

## 2019-06-29 DIAGNOSIS — B20 Human immunodeficiency virus [HIV] disease: Secondary | ICD-10-CM

## 2019-06-29 MED ORDER — GENVOYA 150-150-200-10 MG PO TABS: 1.0000 | ORAL_TABLET | Freq: Every day | ORAL | 0 refills | Status: DC

## 2019-09-01 ENCOUNTER — Other Ambulatory Visit: Payer: Self-pay

## 2019-09-01 ENCOUNTER — Telehealth: Payer: Self-pay | Admitting: Pharmacy Technician

## 2019-09-01 ENCOUNTER — Ambulatory Visit: Payer: Self-pay

## 2019-09-01 NOTE — Telephone Encounter (Addendum)
[facility] Patient Advocate Encounter   Patient has been approved for [employer] Advancing Access Patient Assistance Program for [facility] for up to 82-months starting 09/01/2019. This assistance will make the patient's copay $0.  I have spoken with the patient and they will communicate with [facility] and have their medication shipped.  The billing information is    Patient knows to call the office with questions or concerns. Over income for [place], [place] Patient [facility] for Infectious Disease Phone: [phone] Fax: [phone] 09/01/2019 9:11 AM

## 2019-09-02 ENCOUNTER — Other Ambulatory Visit: Payer: Self-pay

## 2019-09-02 DIAGNOSIS — B20 Human immunodeficiency virus [HIV] disease: Secondary | ICD-10-CM

## 2019-09-02 MED ORDER — GENVOYA 150-150-200-10 MG PO TABS: 1.0000 | ORAL_TABLET | Freq: Every day | ORAL | 1 refills | Status: DC

## 2019-10-20 ENCOUNTER — Encounter: Payer: Self-pay | Admitting: Internal Medicine

## 2019-10-20 ENCOUNTER — Other Ambulatory Visit: Payer: Self-pay

## 2019-10-20 ENCOUNTER — Ambulatory Visit (INDEPENDENT_AMBULATORY_CARE_PROVIDER_SITE_OTHER): Payer: Self-pay | Admitting: Internal Medicine

## 2019-10-20 VITALS — BP 193/149 | HR 92 | Temp 98.7°F | Ht 64.0 in | Wt 178.0 lb

## 2019-10-20 DIAGNOSIS — I10 Essential (primary) hypertension: Secondary | ICD-10-CM

## 2019-10-20 DIAGNOSIS — B20 Human immunodeficiency virus [HIV] disease: Secondary | ICD-10-CM

## 2019-10-20 DIAGNOSIS — Z7185 Encounter for immunization safety counseling: Secondary | ICD-10-CM

## 2019-10-20 DIAGNOSIS — Z79899 Other long term (current) drug therapy: Secondary | ICD-10-CM

## 2019-10-20 DIAGNOSIS — Z113 Encounter for screening for infections with a predominantly sexual mode of transmission: Secondary | ICD-10-CM

## 2019-10-20 DIAGNOSIS — Z803 Family history of malignant neoplasm of breast: Secondary | ICD-10-CM

## 2019-10-20 DIAGNOSIS — Z7189 Other specified counseling: Secondary | ICD-10-CM

## 2019-10-20 MED ORDER — GENVOYA 150-150-200-10 MG PO TABS: 1.0000 | ORAL_TABLET | Freq: Every day | ORAL | 11 refills | Status: DC

## 2019-10-20 NOTE — Assessment & Plan Note (Signed)
Will screen today 

## 2019-10-20 NOTE — Assessment & Plan Note (Signed)
I discussed her BP with her and high risk for stroke and other concerns.  She voiced her understanding and remains uninterested in treatment or in getting a PCP

## 2019-10-20 NOTE — Assessment & Plan Note (Signed)
She continues to refuse vaccines.

## 2019-10-20 NOTE — Assessment & Plan Note (Signed)
She has been counseled on regular mammograms due to her family history.

## 2019-10-20 NOTE — Progress Notes (Signed)
Provided patient with Jacqueline Lowe information and specialty pharmacy contact information to set up mail delivery.  Jacqueline Lowe

## 2019-10-20 NOTE — Progress Notes (Signed)
   Subjective:    Patient ID: Jacqueline Lowe, adult    DOB: 1975-06-14, 44 y.o.   MRN: [id number]  HPI Here for follow up of HIV.  She continues on Genvoya and no missed doses.  She has conttinued to have a suppressed viral load and good CD4 count.  SHe has no complaints today.  She has insurance and having issues figuring out the specialty pharmacy.  She is not taking her BP medication and has decided she is not going to take anything for blood pressure.  I last year started her on Norvasc since she did not have a PCP and she took for awhile but stopped.  She did not give a reason, just that she had decided not to take blood pressure medication.    Review of Systems  Gastrointestinal: Negative for diarrhea and nausea.  Neurological: Negative for dizziness, light-headedness and headaches.       Objective:   Physical Exam Constitutional:      Appearance: Normal appearance.  Eyes:     General: No scleral icterus. Cardiovascular:     Rate and Rhythm: Normal rate and regular rhythm.     Heart sounds: No murmur heard.   Pulmonary:     Effort: Pulmonary effort is normal. No respiratory distress.  Neurological:     Mental Status: He is alert.  Psychiatric:        Mood and Affect: Mood normal.   SH: no tobacco        Assessment & Plan:

## 2019-10-20 NOTE — Assessment & Plan Note (Signed)
She is doing well on Genvoya and no concerns.  Labs today and rtc in 1 year unless concerns.

## 2019-10-21 LAB — URINE CYTOLOGY ANCILLARY ONLY
Chlamydia: NEGATIVE
Comment: NEGATIVE
Comment: NORMAL
Neisseria Gonorrhea: NEGATIVE

## 2019-10-21 LAB — T-HELPER CELL (CD4) - (RCID CLINIC ONLY)
CD4 % Helper T Cell: 41 % (ref 33–65)
CD4 T Cell Abs: 792 /uL (ref 400–1790)

## 2019-10-22 LAB — COMPLETE METABOLIC PANEL WITH GFR
AG Ratio: 1.3 (calc) (ref 1.0–2.5)
ALT: 11 U/L (ref 6–29)
AST: 11 U/L (ref 10–30)
Albumin: 4.3 g/dL (ref 3.6–5.1)
Alkaline phosphatase (APISO): 55 U/L (ref 31–125)
BUN: 9 mg/dL (ref 7–25)
CO2: 27 mmol/L (ref 20–32)
Calcium: 10.3 mg/dL — ABNORMAL HIGH (ref 8.6–10.2)
Chloride: 106 mmol/L (ref 98–110)
Creat: 0.64 mg/dL (ref 0.50–1.10)
GFR, Est African American: 128 mL/min/{1.73_m2} (ref >=60)
GFR, Est Non African American: 110 mL/min/{1.73_m2} (ref >=60)
Globulin: 3.3 g/dL (calc) (ref 1.9–3.7)
Glucose, Bld: 115 mg/dL — ABNORMAL HIGH (ref 65–99)
Potassium: 3.4 mmol/L — ABNORMAL LOW (ref 3.5–5.3)
Sodium: 140 mmol/L (ref 135–146)
Total Bilirubin: 0.4 mg/dL (ref 0.2–1.2)
Total Protein: 7.6 g/dL (ref 6.1–8.1)

## 2019-10-22 LAB — CBC WITH DIFFERENTIAL/PLATELET
Absolute Monocytes: 326 cells/uL (ref 200–950)
Basophils Absolute: 41 cells/uL (ref 0–200)
Basophils Relative: 0.8 %
Eosinophils Absolute: 128 cells/uL (ref 15–500)
Eosinophils Relative: 2.5 %
HCT: 42.9 % (ref 35.0–45.0)
Hemoglobin: 14.3 g/dL (ref 11.7–15.5)
Lymphs Abs: 1938 cells/uL (ref 850–3900)
MCH: 29.4 pg (ref 27.0–33.0)
MCHC: 33.3 g/dL (ref 32.0–36.0)
MCV: 88.1 fL (ref 80.0–100.0)
MPV: 11.3 fL (ref 7.5–12.5)
Monocytes Relative: 6.4 %
Neutro Abs: 2667 cells/uL (ref 1500–7800)
Neutrophils Relative %: 52.3 %
Platelets: 354 10*3/uL (ref 140–400)
RBC: 4.87 10*6/uL (ref 3.80–5.10)
RDW: 12.9 % (ref 11.0–15.0)
Total Lymphocyte: 38 %
WBC: 5.1 10*3/uL (ref 3.8–10.8)

## 2019-10-22 LAB — LIPID PANEL
Cholesterol: 242 mg/dL — ABNORMAL HIGH (ref <200)
HDL: 30 mg/dL — ABNORMAL LOW (ref >=50)
LDL Cholesterol (Calc): 165 mg/dL (calc) — ABNORMAL HIGH
Non-HDL Cholesterol (Calc): 212 mg/dL (calc) — ABNORMAL HIGH (ref <130)
Total CHOL/HDL Ratio: 8.1 (calc) — ABNORMAL HIGH (ref <5.0)
Triglycerides: 305 mg/dL — ABNORMAL HIGH (ref <150)

## 2019-10-22 LAB — RPR: RPR Ser Ql: NONREACTIVE

## 2019-10-22 LAB — HIV-1 RNA QUANT-NO REFLEX-BLD
HIV 1 RNA Quant: <20 copies/mL
HIV-1 RNA Quant, Log: <1.3 Log copies/mL

## 2020-04-09 ENCOUNTER — Other Ambulatory Visit: Payer: Self-pay | Admitting: Internal Medicine

## 2020-09-17 ENCOUNTER — Encounter: Payer: Self-pay | Admitting: Internal Medicine

## 2020-10-31 ENCOUNTER — Ambulatory Visit: Payer: Self-pay | Admitting: Internal Medicine

## 2020-11-16 ENCOUNTER — Other Ambulatory Visit: Payer: Self-pay

## 2020-11-16 ENCOUNTER — Ambulatory Visit (INDEPENDENT_AMBULATORY_CARE_PROVIDER_SITE_OTHER): Payer: Self-pay | Admitting: Internal Medicine

## 2020-11-16 ENCOUNTER — Ambulatory Visit: Payer: Self-pay

## 2020-11-16 ENCOUNTER — Encounter: Payer: Self-pay | Admitting: Internal Medicine

## 2020-11-16 ENCOUNTER — Other Ambulatory Visit (HOSPITAL_COMMUNITY)
Admission: RE | Admit: 2020-11-16 | Discharge: 2020-11-16 | Disposition: A | Discharge status: Home / Self Care | Payer: Self-pay | Source: Ambulatory Visit | Attending: Internal Medicine | Admitting: Internal Medicine

## 2020-11-16 VITALS — BP 199/128 | HR 91 | Temp 98.5°F | Wt 167.0 lb

## 2020-11-16 DIAGNOSIS — B20 Human immunodeficiency virus [HIV] disease: Secondary | ICD-10-CM

## 2020-11-16 DIAGNOSIS — Z113 Encounter for screening for infections with a predominantly sexual mode of transmission: Secondary | ICD-10-CM

## 2020-11-16 DIAGNOSIS — Z79899 Other long term (current) drug therapy: Secondary | ICD-10-CM

## 2020-11-16 DIAGNOSIS — I10 Essential (primary) hypertension: Secondary | ICD-10-CM

## 2020-11-16 MED ORDER — GENVOYA 150-150-200-10 MG PO TABS: 1.0000 | ORAL_TABLET | Freq: Every day | ORAL | 11 refills | Status: DC

## 2020-11-16 NOTE — Assessment & Plan Note (Signed)
Blood pressure remains high and I again discussed the need for treatment.  He is not interested in any treatment and not interested in going to a PCP I discussed again the risk of stroke, MI with untreated high blood pressure She voiced her understanding

## 2020-11-16 NOTE — Assessment & Plan Note (Signed)
No issues in regards to HIV over the year other than changes to insurance, coverage.  Will continue with Genvoya with no changes and rtc in 1 year.  He knows to call if there are any new issues.

## 2020-11-16 NOTE — Assessment & Plan Note (Signed)
Will screen 

## 2020-11-16 NOTE — Addendum Note (Signed)
Addended by: Thayer Headings on: 11/16/2020 09:23 AM   Modules accepted: Orders

## 2020-11-16 NOTE — Progress Notes (Signed)
   Subjective:    Patient ID: Jacqueline Lowe, adult    DOB: 1975-08-21, 45 y.o.   MRN: [id number]  HPI Here for follow up of HIV She continues on Genvoya with no missed doses.  Has not been in to care with a PCP despite multiple other issues.  She tells me she does not have time to go to a PCP.  She is having some issues with her joints and is in a very active, physical job.  No labs done prior to the visit.  Has not had any issues obtaining, taking or tolerating Genvoya.     Review of Systems  Constitutional:  Negative for fatigue and unexpected weight change.  Gastrointestinal:  Negative for diarrhea and nausea.  Skin:  Negative for rash.      Objective:   Physical Exam Eyes:     General: No scleral icterus. Cardiovascular:     Rate and Rhythm: Normal rate and regular rhythm.     Heart sounds: No murmur heard. Pulmonary:     Effort: Pulmonary effort is normal.  Skin:    Findings: No rash.  Neurological:     General: No focal deficit present.     Mental Status: He is alert.   SH: no tobacco       Assessment & Plan:

## 2020-11-16 NOTE — Addendum Note (Signed)
Addended by: Thayer Headings on: 11/16/2020 09:14 AM   Modules accepted: Orders

## 2020-11-19 LAB — URINE CYTOLOGY ANCILLARY ONLY
Chlamydia: NEGATIVE
Comment: NEGATIVE
Comment: NORMAL
Neisseria Gonorrhea: NEGATIVE

## 2020-11-20 LAB — LIPID PANEL
Cholesterol: 261 mg/dL — ABNORMAL HIGH (ref <200)
HDL: 35 mg/dL — ABNORMAL LOW (ref >=50)
LDL Cholesterol (Calc): 191 mg/dL (calc) — ABNORMAL HIGH
Non-HDL Cholesterol (Calc): 226 mg/dL (calc) — ABNORMAL HIGH (ref <130)
Total CHOL/HDL Ratio: 7.5 (calc) — ABNORMAL HIGH (ref <5.0)
Triglycerides: 179 mg/dL — ABNORMAL HIGH (ref <150)

## 2020-11-20 LAB — COMPLETE METABOLIC PANEL WITH GFR
AG Ratio: 1.2 (calc) (ref 1.0–2.5)
ALT: 14 U/L (ref 6–29)
AST: 15 U/L (ref 10–30)
Albumin: 4.5 g/dL (ref 3.6–5.1)
Alkaline phosphatase (APISO): 51 U/L (ref 31–125)
BUN: 12 mg/dL (ref 7–25)
CO2: 25 mmol/L (ref 20–32)
Calcium: 11.1 mg/dL — ABNORMAL HIGH (ref 8.6–10.2)
Chloride: 106 mmol/L (ref 98–110)
Creat: 0.78 mg/dL (ref 0.50–0.99)
Globulin: 3.9 g/dL (calc) — ABNORMAL HIGH (ref 1.9–3.7)
Glucose, Bld: 96 mg/dL (ref 65–99)
Potassium: 3.5 mmol/L (ref 3.5–5.3)
Sodium: 139 mmol/L (ref 135–146)
Total Bilirubin: 0.5 mg/dL (ref 0.2–1.2)
Total Protein: 8.4 g/dL — ABNORMAL HIGH (ref 6.1–8.1)
eGFR: 97 mL/min/{1.73_m2} (ref >=60)

## 2020-11-20 LAB — CBC WITH DIFFERENTIAL/PLATELET
Absolute Monocytes: 338 cells/uL (ref 200–950)
Basophils Absolute: 61 cells/uL (ref 0–200)
Basophils Relative: 1.3 %
Eosinophils Absolute: 71 cells/uL (ref 15–500)
Eosinophils Relative: 1.5 %
HCT: 44.2 % (ref 35.0–45.0)
Hemoglobin: 14.5 g/dL (ref 11.7–15.5)
Lymphs Abs: 1598 cells/uL (ref 850–3900)
MCH: 29.4 pg (ref 27.0–33.0)
MCHC: 32.8 g/dL (ref 32.0–36.0)
MCV: 89.7 fL (ref 80.0–100.0)
MPV: 10.7 fL (ref 7.5–12.5)
Monocytes Relative: 7.2 %
Neutro Abs: 2632 cells/uL (ref 1500–7800)
Neutrophils Relative %: 56 %
Platelets: 393 10*3/uL (ref 140–400)
RBC: 4.93 10*6/uL (ref 3.80–5.10)
RDW: 12.3 % (ref 11.0–15.0)
Total Lymphocyte: 34 %
WBC: 4.7 10*3/uL (ref 3.8–10.8)

## 2020-11-20 LAB — T-HELPER CELLS (CD4) COUNT (NOT AT ARMC)
Absolute CD4: 650 cells/uL (ref 490–1740)
CD4 T Helper %: 42 % (ref 30–61)
Total lymphocyte count: 1560 cells/uL (ref 850–3900)

## 2020-11-20 LAB — RPR: RPR Ser Ql: NONREACTIVE

## 2020-11-20 LAB — HIV-1 RNA QUANT-NO REFLEX-BLD
HIV 1 RNA Quant: NOT DETECTED Copies/mL
HIV-1 RNA Quant, Log: NOT DETECTED Log cps/mL

## 2021-01-19 ENCOUNTER — Emergency Department (HOSPITAL_COMMUNITY): Payer: Self-pay

## 2021-01-19 ENCOUNTER — Other Ambulatory Visit: Payer: Self-pay

## 2021-01-19 ENCOUNTER — Emergency Department (HOSPITAL_COMMUNITY)
Admission: EM | Admit: 2021-01-19 | Discharge: 2021-01-19 | Disposition: A | Discharge status: Home / Self Care | Payer: Self-pay | Attending: Emergency Medicine | Admitting: Emergency Medicine

## 2021-01-19 ENCOUNTER — Encounter (HOSPITAL_COMMUNITY): Payer: Self-pay | Admitting: Emergency Medicine

## 2021-01-19 DIAGNOSIS — I10 Essential (primary) hypertension: Secondary | ICD-10-CM | POA: Insufficient documentation

## 2021-01-19 DIAGNOSIS — R000 Tachycardia, unspecified: Secondary | ICD-10-CM | POA: Insufficient documentation

## 2021-01-19 DIAGNOSIS — R1011 Right upper quadrant pain: Secondary | ICD-10-CM | POA: Insufficient documentation

## 2021-01-19 DIAGNOSIS — Z79899 Other long term (current) drug therapy: Secondary | ICD-10-CM | POA: Insufficient documentation

## 2021-01-19 DIAGNOSIS — R110 Nausea: Secondary | ICD-10-CM | POA: Insufficient documentation

## 2021-01-19 DIAGNOSIS — Z87891 Personal history of nicotine dependence: Secondary | ICD-10-CM | POA: Insufficient documentation

## 2021-01-19 LAB — URINALYSIS, ROUTINE W REFLEX MICROSCOPIC
Bilirubin Urine: NEGATIVE
Glucose, UA: NEGATIVE mg/dL
Ketones, ur: 5 mg/dL — AB
Leukocytes,Ua: NEGATIVE
Nitrite: NEGATIVE
Protein, ur: 100 mg/dL — AB
Specific Gravity, Urine: 1.009 (ref 1.005–1.030)
pH: 8 (ref 5.0–8.0)

## 2021-01-19 LAB — COMPREHENSIVE METABOLIC PANEL
ALT: 15 U/L (ref 0–44)
AST: 16 U/L (ref 15–41)
Albumin: 4.3 g/dL (ref 3.5–5.0)
Alkaline Phosphatase: 55 U/L (ref 38–126)
Anion gap: 8 (ref 5–15)
BUN: 9 mg/dL (ref 6–20)
CO2: 27 mmol/L (ref 22–32)
Calcium: 10.3 mg/dL (ref 8.9–10.3)
Chloride: 101 mmol/L (ref 98–111)
Creatinine, Ser: 0.73 mg/dL (ref 0.44–1.00)
GFR, Estimated: >60 mL/min (ref >60)
Glucose, Bld: 110 mg/dL — ABNORMAL HIGH (ref 70–99)
Potassium: 3.4 mmol/L — ABNORMAL LOW (ref 3.5–5.1)
Sodium: 136 mmol/L (ref 135–145)
Total Bilirubin: 0.8 mg/dL (ref 0.3–1.2)
Total Protein: 8.4 g/dL — ABNORMAL HIGH (ref 6.5–8.1)

## 2021-01-19 LAB — CBC WITH DIFFERENTIAL/PLATELET
Abs Immature Granulocytes: 0.03 10*3/uL (ref 0.00–0.07)
Basophils Absolute: 0.1 10*3/uL (ref 0.0–0.1)
Basophils Relative: 1 %
Eosinophils Absolute: 0.1 10*3/uL (ref 0.0–0.5)
Eosinophils Relative: 1 %
HCT: 44 % (ref 36.0–46.0)
Hemoglobin: 14.5 g/dL (ref 12.0–15.0)
Immature Granulocytes: 0 %
Lymphocytes Relative: 34 %
Lymphs Abs: 2.5 10*3/uL (ref 0.7–4.0)
MCH: 29.5 pg (ref 26.0–34.0)
MCHC: 33 g/dL (ref 30.0–36.0)
MCV: 89.4 fL (ref 80.0–100.0)
Monocytes Absolute: 0.6 10*3/uL (ref 0.1–1.0)
Monocytes Relative: 8 %
Neutro Abs: 4.1 10*3/uL (ref 1.7–7.7)
Neutrophils Relative %: 56 %
Platelets: 362 10*3/uL (ref 150–400)
RBC: 4.92 MIL/uL (ref 3.87–5.11)
RDW: 11.9 % (ref 11.5–15.5)
WBC: 7.4 10*3/uL (ref 4.0–10.5)
nRBC: 0 % (ref 0.0–0.2)

## 2021-01-19 MED ORDER — ONDANSETRON 4 MG PO TBDP: 4.0000 mg | ORAL_TABLET | Freq: Three times a day (TID) | ORAL | 0 refills | Status: DC | PRN

## 2021-01-19 MED ORDER — SODIUM CHLORIDE 0.9 % IV BOLUS
1000.0000 mL | Freq: Once | INTRAVENOUS | Status: AC
  Administered 2021-01-19: 1000 mL via INTRAVENOUS

## 2021-01-19 MED ORDER — PANTOPRAZOLE SODIUM 40 MG PO TBEC
40.0000 mg | DELAYED_RELEASE_TABLET | Freq: Once | ORAL | Status: AC
  Administered 2021-01-19: 40 mg via ORAL
  Filled 2021-01-19: qty 1

## 2021-01-19 MED ORDER — ONDANSETRON HCL 4 MG/2ML IJ SOLN
4.0000 mg | Freq: Once | INTRAMUSCULAR | Status: AC
  Administered 2021-01-19: 4 mg via INTRAVENOUS
  Filled 2021-01-19: qty 2

## 2021-01-19 MED ORDER — AMLODIPINE BESYLATE 5 MG PO TABS
10.0000 mg | ORAL_TABLET | Freq: Once | ORAL | Status: AC
  Administered 2021-01-19: 10 mg via ORAL
  Filled 2021-01-19: qty 2

## 2021-01-19 MED ORDER — PANTOPRAZOLE SODIUM 40 MG PO TBEC: 40.0000 mg | DELAYED_RELEASE_TABLET | Freq: Every day | ORAL | 0 refills | Status: DC

## 2021-01-19 MED ORDER — MORPHINE SULFATE (PF) 4 MG/ML IV SOLN
4.0000 mg | Freq: Once | INTRAVENOUS | Status: AC
  Administered 2021-01-19: 4 mg via INTRAVENOUS
  Filled 2021-01-19: qty 1

## 2021-01-19 MED ORDER — PANTOPRAZOLE SODIUM 40 MG PO TBEC: 40.0000 mg | DELAYED_RELEASE_TABLET | Freq: Every day | ORAL | 0 refills | Status: AC

## 2021-01-19 MED ORDER — AMLODIPINE BESYLATE 10 MG PO TABS: 10.0000 mg | ORAL_TABLET | Freq: Every day | ORAL | 0 refills | Status: DC

## 2021-01-19 MED ORDER — AMLODIPINE BESYLATE 10 MG PO TABS
10.0000 mg | ORAL_TABLET | Freq: Every day | ORAL | 0 refills | Status: AC
Start: 2021-01-19 — End: ?

## 2021-01-19 MED ORDER — ONDANSETRON 4 MG PO TBDP: 4.0000 mg | ORAL_TABLET | Freq: Three times a day (TID) | ORAL | 0 refills | Status: AC | PRN

## 2021-01-19 NOTE — ED Provider Notes (Signed)
[facility] EMERGENCY DEPARTMENT Provider Note   CSN: [id number] Arrival date & time: 01/19/21  8101     History Chief Complaint  Patient presents with   Flank Pain    Jacqueline Lowe is a 45 y.o. adult.  Pt presents to the ED today with right sided flank pain for the past 4 days.  She has had nausea, but no vomiting.  She denies any urinary sx.  No fever.       Past Medical History:  Diagnosis Date   Anemia    Cervical cancer ([place])    dx at age 45   HIV (human immunodeficiency virus infection) ([place])    reported to be HIV+ by patient   HLD (hyperlipidemia)    HTN (hypertension)     Patient Active Problem List   Diagnosis Date Noted   History of robot-assisted laparoscopic hysterectomy 08/13/2017   Vaccine counseling 02/12/2017   Frequent urination 10/25/2015   Insomnia 10/16/2015   Hyperlipidemia LDL goal <100 09/19/2015   History of cervical cancer 06/28/2015   Essential hypertension 06/26/2015   History of anemia 03/15/2015   Hyperlipidemia 03/15/2015   Routine health maintenance 03/15/2015   Human immunodeficiency virus disease ([place]) 10/02/2014   Screening examination for venereal disease 10/02/2014   Encounter for long-term (current) use of medications 10/02/2014   Family history of breast cancer 06/01/2014    Past Surgical History:  Procedure Laterality Date   ABDOMINAL HYSTERECTOMY     excessive bleeding; ovaries remain intact   LEEP       OB History   No obstetric history on file.     Family History  Problem Relation Age of Onset   Cervical cancer Mother    Diabetes Mother    Heart disease Mother    Other Father        does not know father or pat history   Breast cancer Maternal Aunt 77       x 2   Colon polyps Maternal Aunt    Diabetes Maternal Aunt        x 2   Heart disease Maternal Aunt    Kidney cancer Maternal Aunt     Social History   Tobacco Use   Smoking status: Former    Types: Cigarettes    Quit date: 04/28/1997     Years since quitting: 23.7   Smokeless tobacco: Never  Vaping Use   Vaping Use: Never used  Substance Use Topics   Alcohol use: Yes    Alcohol/week: 0.0 standard drinks    Comment: socially   Drug use: No    Comment: occasionally    Home Medications Prior to Admission medications   Medication Sig Start Date End Date Taking? Authorizing Provider  amLODipine (NORVASC) 10 MG tablet Take 1 tablet (10 mg total) by mouth daily. 01/19/21  Yes Isla Pence, MD  ondansetron (ZOFRAN ODT) 4 MG disintegrating tablet Take 1 tablet (4 mg total) by mouth every 8 (eight) hours as needed for nausea or vomiting. 01/19/21  Yes Isla Pence, MD  pantoprazole (PROTONIX) 40 MG tablet Take 1 tablet (40 mg total) by mouth daily. 01/19/21  Yes Isla Pence, MD  elvitegravir-cobicistat-emtricitabine-tenofovir (GENVOYA) 150-150-200-10 MG TABS tablet Take 1 tablet by mouth daily. 11/16/20   Thayer Headings, MD  Vitamin D, Ergocalciferol, (DRISDOL) 50000 units CAPS capsule Take 50,000 Units by mouth every 7 (seven) days.    [provider]    Allergies    Patient has no known allergies.  Review of Systems   Review of Systems  Gastrointestinal:  Positive for abdominal pain and nausea.  All other systems reviewed and are negative.  Physical Exam Updated Vital Signs BP (!) 235/127   Pulse 84   Temp 98.3 F (36.8 C) (Oral)   Resp (!) 22   Ht 5\' 4"  (1.626 m)   Wt 72.6 kg   LMP 10/02/2011   SpO2 100%   BMI 27.46 kg/m   Physical Exam Vitals and nursing note reviewed.  Constitutional:      Appearance: Normal appearance.  HENT:     Head: Normocephalic and atraumatic.     Right Ear: External ear normal.     Left Ear: External ear normal.     Nose: Nose normal.     Mouth/Throat:     Mouth: Mucous membranes are moist.     Pharynx: Oropharynx is clear.  Eyes:     Extraocular Movements: Extraocular movements intact.     Conjunctiva/sclera: Conjunctivae normal.     Pupils: Pupils are  equal, round, and reactive to light.  Cardiovascular:     Rate and Rhythm: Regular rhythm. Tachycardia present.     Pulses: Normal pulses.     Heart sounds: Normal heart sounds.  Pulmonary:     Effort: Pulmonary effort is normal.     Breath sounds: Normal breath sounds.  Abdominal:     General: Abdomen is flat. Bowel sounds are normal.     Palpations: Abdomen is soft.     Tenderness: There is abdominal tenderness in the right upper quadrant and right lower quadrant.  Musculoskeletal:        General: Normal range of motion.     Cervical back: Normal range of motion and neck supple.  Skin:    General: Skin is warm.     Capillary Refill: Capillary refill takes less than 2 seconds.  Neurological:     General: No focal deficit present.     Mental Status: He is alert and oriented to person, place, and time.  Psychiatric:        Mood and Affect: Mood normal.        Behavior: Behavior normal.    ED Results / Procedures / Treatments   Labs (all labs ordered are listed, but only abnormal results are displayed) Labs Reviewed  COMPREHENSIVE METABOLIC PANEL - Abnormal; Notable for the following components:      Result Value   Potassium 3.4 (*)    Glucose, Bld 110 (*)    Total Protein 8.4 (*)    All other components within normal limits  URINALYSIS, ROUTINE W REFLEX MICROSCOPIC - Abnormal; Notable for the following components:   APPearance HAZY (*)    Hgb urine dipstick SMALL (*)    Ketones, ur 5 (*)    Protein, ur 100 (*)    Bacteria, UA RARE (*)    All other components within normal limits  CBC WITH DIFFERENTIAL/PLATELET    EKG None  Radiology CT ABDOMEN PELVIS WO CONTRAST  Result Date: 01/19/2021 CLINICAL DATA:  Abdominal pain described as right flank pain for the last 4 days. EXAM: CT ABDOMEN AND PELVIS WITHOUT CONTRAST TECHNIQUE: Multidetector CT imaging of the abdomen and pelvis was performed following the standard protocol without IV contrast. COMPARISON:  None. FINDINGS:  Lower chest: No acute abnormality. Hepatobiliary: Normal liver. Small dependent gallstone. No gallbladder wall thickening or evidence of inflammation. No bile duct dilation. Pancreas: Unremarkable. No pancreatic ductal dilatation or surrounding inflammatory changes. Spleen: Normal  in size without focal abnormality. Adrenals/Urinary Tract: Adrenal glands are unremarkable. Kidneys are normal, without renal calculi, focal lesion, or hydronephrosis. Bladder is unremarkable. Stomach/Bowel: Normal stomach. Small bowel and colon are normal in caliber. No wall thickening. No inflammation. Multiple colonic diverticula. No diverticulitis. Normal appendix visualized. Vascular/Lymphatic: No significant vascular findings are present. No enlarged abdominal or pelvic lymph nodes. Reproductive: Status post hysterectomy. No adnexal masses. Other: No abdominal wall hernia or abnormality. No abdominopelvic ascites. Musculoskeletal: No fracture or acute finding. No bone lesion. Bilateral hip joint degenerative/arthropathic changes. IMPRESSION: 1. No acute findings within the abdomen or pelvis. No renal or ureteral stones. No findings to account for the patient's abdominal pain. 2. Colonic diverticula without evidence of diverticulitis. Single gallstone. Electronically Signed   By: Lajean Manes M.D.   On: 01/19/2021 10:03    Procedures Procedures   Medications Ordered in ED Medications  amLODipine (NORVASC) tablet 10 mg (has no administration in time range)  pantoprazole (PROTONIX) EC tablet 40 mg (has no administration in time range)  sodium chloride 0.9 % bolus 1,000 mL (1,000 mLs Intravenous New Bag/Given 01/19/21 0917)  morphine 4 MG/ML injection 4 mg (4 mg Intravenous Given 01/19/21 0918)  ondansetron (ZOFRAN) injection 4 mg (4 mg Intravenous Given 01/19/21 1610)    ED Course  I have reviewed the triage vital signs and the nursing notes.  Pertinent labs & imaging results that were available during my care of the  patient were reviewed by me and considered in my medical decision making (see chart for details).    MDM Rules/Calculators/A&P                           Pain is much improved and pt looks better.   BP is quite elevated.  Pt said she has not been on her bp meds in over 2 years because she does not have a doctor or insurance.  I am going to consult toc to help with meds and with pcp.  Pt given a refill of her bp meds (norvasc) and is given her first dose in the ED.  She is referred to GI.  She is to start on protonix.  She is given her first dose here.  Return if worse. Final Clinical Impression(s) / ED Diagnoses Final diagnoses:  Right upper quadrant abdominal pain  Hypertension, unspecified type    Rx / DC Orders ED Discharge Orders          Ordered    Ambulatory referral to Gastroenterology        01/19/21 1038    amLODipine (NORVASC) 10 MG tablet  Daily        01/19/21 1040    ondansetron (ZOFRAN ODT) 4 MG disintegrating tablet  Every 8 hours PRN        01/19/21 1040    pantoprazole (PROTONIX) 40 MG tablet  Daily        01/19/21 1040             Isla Pence, MD 01/19/21 1041

## 2021-01-19 NOTE — ED Triage Notes (Signed)
Pt to the ED with right flank pain for the last 4 days. Pt denies urinary symptoms, vomiting and diarrhea.

## 2021-05-15 ENCOUNTER — Other Ambulatory Visit (HOSPITAL_COMMUNITY): Payer: Self-pay

## 2021-05-16 ENCOUNTER — Ambulatory Visit: Payer: Self-pay

## 2021-05-16 ENCOUNTER — Other Ambulatory Visit: Payer: Self-pay

## 2021-11-29 ENCOUNTER — Ambulatory Visit (INDEPENDENT_AMBULATORY_CARE_PROVIDER_SITE_OTHER): Payer: Self-pay | Admitting: Internal Medicine

## 2021-11-29 ENCOUNTER — Ambulatory Visit: Payer: Self-pay

## 2021-11-29 ENCOUNTER — Other Ambulatory Visit: Payer: Self-pay

## 2021-11-29 ENCOUNTER — Other Ambulatory Visit (HOSPITAL_COMMUNITY): Payer: Self-pay

## 2021-11-29 ENCOUNTER — Other Ambulatory Visit (HOSPITAL_COMMUNITY)
Admission: RE | Admit: 2021-11-29 | Discharge: 2021-11-29 | Disposition: A | Discharge status: Home / Self Care | Payer: Self-pay | Source: Ambulatory Visit | Attending: Internal Medicine | Admitting: Internal Medicine

## 2021-11-29 ENCOUNTER — Encounter: Payer: Self-pay | Admitting: Internal Medicine

## 2021-11-29 ENCOUNTER — Ambulatory Visit: Payer: Self-pay | Admitting: Internal Medicine

## 2021-11-29 VITALS — BP 156/111 | HR 106 | Resp 16 | Ht 64.0 in | Wt 174.0 lb

## 2021-11-29 DIAGNOSIS — Z79899 Other long term (current) drug therapy: Secondary | ICD-10-CM

## 2021-11-29 DIAGNOSIS — Z113 Encounter for screening for infections with a predominantly sexual mode of transmission: Secondary | ICD-10-CM

## 2021-11-29 DIAGNOSIS — B20 Human immunodeficiency virus [HIV] disease: Secondary | ICD-10-CM | POA: Insufficient documentation

## 2021-11-29 MED ORDER — GENVOYA 150-150-200-10 MG PO TABS: 1.0000 | ORAL_TABLET | Freq: Every day | ORAL | 11 refills | Status: AC

## 2021-11-29 NOTE — Assessment & Plan Note (Signed)
Will screen 

## 2021-11-29 NOTE — Progress Notes (Signed)
   Subjective:    Patient ID: Sadia Belfiore, adult    DOB: 1975/08/31, 46 y.o.   MRN: [id number]  HPI Here for follow up of HIV Zetha continues on Genvoya and denies any missed doses.  She though is now out and has not yet renewed her HMAP.  She has done well though and no concerns related to HIV.  Her BP is elevated but she has now started in care at the [facility] who is managing blood pressure and cholesterol.  Here for yearly follow up.    Review of Systems  Constitutional:  Negative for fatigue.  Gastrointestinal:  Negative for diarrhea and nausea.  Skin:  Negative for rash.       Objective:   Physical Exam Eyes:     General: No scleral icterus. Pulmonary:     Effort: Pulmonary effort is normal.  Neurological:     Mental Status: He is alert.  Psychiatric:        Mood and Affect: Mood normal.   SH: no tobacco        Assessment & Plan:

## 2021-11-29 NOTE — Assessment & Plan Note (Signed)
Will check a lipid panel. 

## 2021-11-29 NOTE — Assessment & Plan Note (Signed)
She has done well, though is out of Genvoya.  She is not interested in changing or taking anything else and no samples of Genvoya available so she will be out a short time.  Labs today and I will have her return in about 2 months to recheck the labs after getting back on treatment.  Then she can return to yearly follow up.

## 2021-12-02 ENCOUNTER — Telehealth: Payer: Self-pay

## 2021-12-02 LAB — CBC WITH DIFFERENTIAL/PLATELET
Absolute Monocytes: 395 cells/uL (ref 200–950)
Basophils Absolute: 53 cells/uL (ref 0–200)
Basophils Relative: 0.9 %
Eosinophils Absolute: 118 cells/uL (ref 15–500)
Eosinophils Relative: 2 %
HCT: 39.1 % (ref 35.0–45.0)
Hemoglobin: 13.2 g/dL (ref 11.7–15.5)
Lymphs Abs: 2348 cells/uL (ref 850–3900)
MCH: 29.7 pg (ref 27.0–33.0)
MCHC: 33.8 g/dL (ref 32.0–36.0)
MCV: 88.1 fL (ref 80.0–100.0)
MPV: 10.8 fL (ref 7.5–12.5)
Monocytes Relative: 6.7 %
Neutro Abs: 2985 cells/uL (ref 1500–7800)
Neutrophils Relative %: 50.6 %
Platelets: 399 10*3/uL (ref 140–400)
RBC: 4.44 10*6/uL (ref 3.80–5.10)
RDW: 12.7 % (ref 11.0–15.0)
Total Lymphocyte: 39.8 %
WBC: 5.9 10*3/uL (ref 3.8–10.8)

## 2021-12-02 LAB — COMPLETE METABOLIC PANEL WITH GFR
AG Ratio: 1.2 (calc) (ref 1.0–2.5)
ALT: 20 U/L (ref 6–29)
AST: 15 U/L (ref 10–30)
Albumin: 4.6 g/dL (ref 3.6–5.1)
Alkaline phosphatase (APISO): 68 U/L (ref 31–125)
BUN: 8 mg/dL (ref 7–25)
CO2: 26 mmol/L (ref 20–32)
Calcium: 11.2 mg/dL — ABNORMAL HIGH (ref 8.6–10.2)
Chloride: 104 mmol/L (ref 98–110)
Creat: 0.75 mg/dL (ref 0.50–0.99)
Globulin: 4 g/dL (calc) — ABNORMAL HIGH (ref 1.9–3.7)
Glucose, Bld: 99 mg/dL (ref 65–99)
Potassium: 4.3 mmol/L (ref 3.5–5.3)
Sodium: 136 mmol/L (ref 135–146)
Total Bilirubin: 0.4 mg/dL (ref 0.2–1.2)
Total Protein: 8.6 g/dL — ABNORMAL HIGH (ref 6.1–8.1)
eGFR: 101 mL/min/{1.73_m2} (ref >=60)

## 2021-12-02 LAB — T-HELPER CELLS (CD4) COUNT (NOT AT ARMC)
Absolute CD4: 936 cells/uL (ref 490–1740)
CD4 T Helper %: 43 % (ref 30–61)
Total lymphocyte count: 2185 cells/uL (ref 850–3900)

## 2021-12-02 LAB — URINE CYTOLOGY ANCILLARY ONLY
Chlamydia: NEGATIVE
Comment: NEGATIVE
Comment: NORMAL
Neisseria Gonorrhea: NEGATIVE

## 2021-12-02 LAB — LIPID PANEL
Cholesterol: 194 mg/dL (ref <200)
HDL: 35 mg/dL — ABNORMAL LOW (ref >=50)
LDL Cholesterol (Calc): 122 mg/dL (calc) — ABNORMAL HIGH
Non-HDL Cholesterol (Calc): 159 mg/dL (calc) — ABNORMAL HIGH (ref <130)
Total CHOL/HDL Ratio: 5.5 (calc) — ABNORMAL HIGH (ref <5.0)
Triglycerides: 259 mg/dL — ABNORMAL HIGH (ref <150)

## 2021-12-02 LAB — HIV-1 RNA QUANT-NO REFLEX-BLD
HIV 1 RNA Quant: NOT DETECTED Copies/mL
HIV-1 RNA Quant, Log: NOT DETECTED Log cps/mL

## 2021-12-02 LAB — RPR: RPR Ser Ql: NONREACTIVE

## 2021-12-02 NOTE — Telephone Encounter (Signed)
[facility] Patient Advocate Encounter  Completed and sent [employer] Advancing Access application for Genvoya for this patient who is uninsured.    Patient assistance phone number for follow up is [phone].   This encounter will be updated until final determination.,   Ileene Patrick, [place] Patient [facility] for Infectious Disease Phone: [phone] Fax:  [phone]

## 2021-12-24 ENCOUNTER — Telehealth: Payer: Self-pay

## 2021-12-24 NOTE — Telephone Encounter (Signed)
[facility] Patient Advocate Encounter  Completed and sent [employer] Advancing Access application for Genvoya for this patient who is uninsured.    Patient is approved 12/17/21 through 12/17/22.  BIN      [id number] PCN    [id number] [id number]    [id number] ID        [id number]   Jacqueline Lowe, [place] Patient [facility] for Infectious Disease Phone: [phone] Fax:  [phone]

## 2022-01-29 ENCOUNTER — Ambulatory Visit: Payer: Self-pay | Admitting: Internal Medicine

## 2022-05-20 ENCOUNTER — Telehealth: Payer: Self-pay

## 2022-05-20 ENCOUNTER — Other Ambulatory Visit (HOSPITAL_COMMUNITY): Payer: Self-pay

## 2022-05-20 NOTE — Telephone Encounter (Signed)
[facility] Patient Advocate Encounter  Received a fax from Advancing Access that the patient have insurance and will have to pay a copayment /co insurance of 25%. Have a deductible of $1600.00 which $0.00 have been met as of January 23,2024.   I was able to get a copay coupon card for Genvoya.           Ileene Patrick, [facility] Patient [facility] for Infectious Disease Phone: [phone] Fax:  [phone]

## 2022-07-31 NOTE — Congregational Nurse Program (Signed)
Pt presented to [facility] for CC renewal enrollment and to apply for Medicaid due to need    of a primary care provider to assist with managing her blood pressure.   States she went for a job hire physical on today and they stated her blood pressure was extremely elevated to where she needed to go the Emergency Room on today.  States she did not have the BP numbers to share with me at the time of her visit with me on today from the facility that she received the physical.  Pt states she has a history of high blood pressure with medicines that were prescribed, however it has been at least over 6 months since she last took the medicines.  PLAN Checked BP= ABNORMAL   226/152 (12:08pm)                         235/146 (12:15pm)  Pt was advised she would need to seek immediate care and treatment by way of the emergency room due to severely abnormal blood pressure  Pt states she refuses to go to [facility] ER on today due to having to go to work on tonight (4.4.24) and that if she changes her mind she may decide to go on Saturday morning after her final day of having to go to work for the week.  States she "can't risk getting attendance points which could place her at risk for getting fired"  Pt was educated on importance of attending the ER on today to receive treatment and care and was educated on the complications of high blood pressure as a result of delaying treatment  Pt states she will monitor her BP at home with her own bp monitor and again will make a decision by Saturday morning if she will attend the ER.     I was able to convince the pt to go ahead and make a PCP appointment with [facility], in which she would like to attend, due to her previous history with them.      An appointment was scheduled before she left the clinic at [facility]) for Tuesday, August 05, 2022 @ 8:00am

## 2022-08-18 ENCOUNTER — Other Ambulatory Visit (HOSPITAL_COMMUNITY): Payer: Self-pay

## 2022-10-02 ENCOUNTER — Telehealth: Payer: Self-pay

## 2022-10-02 NOTE — Telephone Encounter (Signed)
Attempted call for follow up, no answer, unable to leave message. Last [facility] appt was 08/07/22 Reschedule in May and June, next appt scheduled for 10/29/22  Francee Nodal RN [facility]

## 2022-11-18 ENCOUNTER — Telehealth: Payer: Self-pay

## 2022-11-18 NOTE — Telephone Encounter (Signed)
Attempted follow up and wellness call to Dual enrolled client of [facility]. Last visit at [facility] was 08/05/22 and visit on 10/29/22 was cancelled no future appointment showing.   No answer and was unable to leave a message.   Francee Nodal RN [facility] [employer]

## 2023-07-21 IMAGING — CT CT ABD-PELV W/O CM
2 of 4 series · 16 of 46 positions shown, 18 images · non-contrast
Comparison: None.

CLINICAL DATA: Abdominal pain described as right flank pain for the
last 4 days.

EXAM:
CT ABDOMEN AND PELVIS WITHOUT CONTRAST
TECHNIQUE: Multidetector CT imaging of the abdomen and pelvis was performed
following the standard protocol without IV contrast.

[Series 2: axial st · axial · 0.98mm/px · z∈[+869,+1314]mm · 13 of 103 slices shown, 15 images]
[im 7/103  soft-tissue]
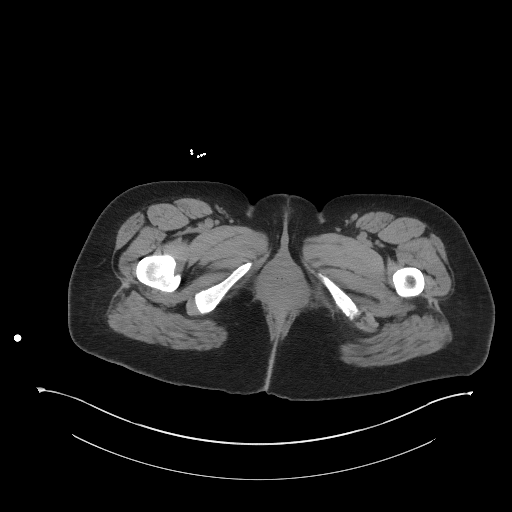
[im 7/103  bone]
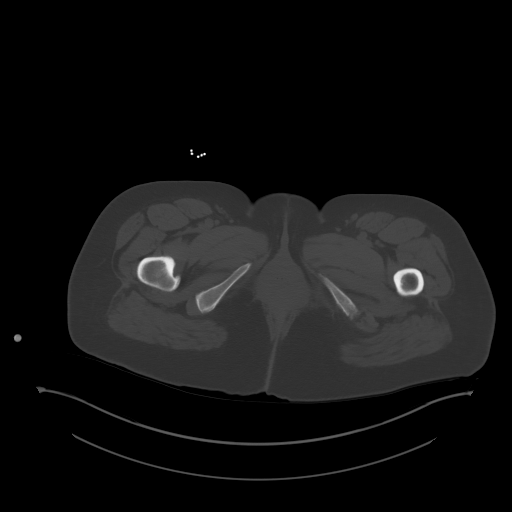
[im 14/103  soft-tissue]
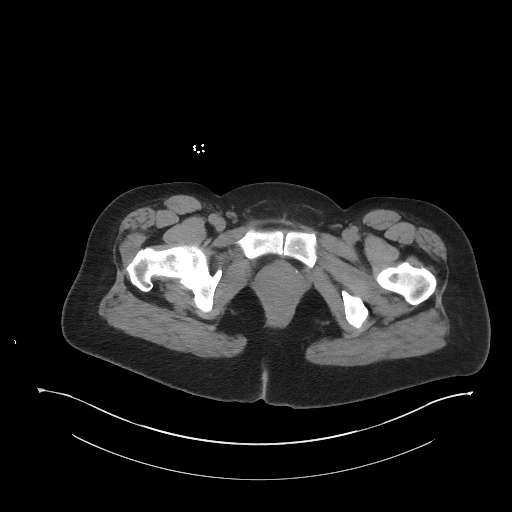
[im 21/103  soft-tissue]
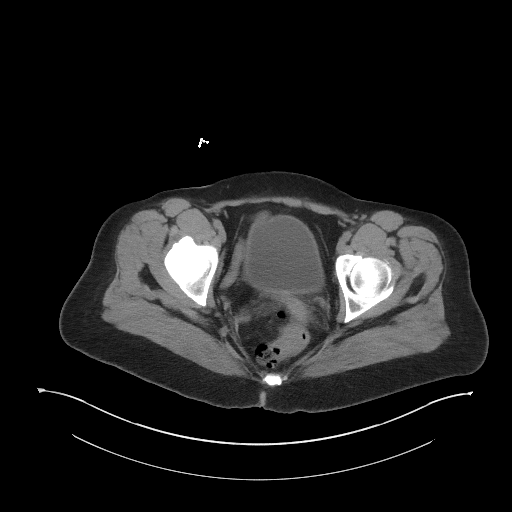
[im 28/103  soft-tissue]
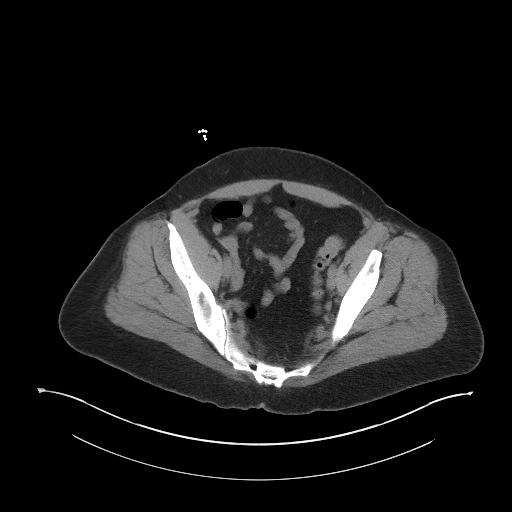
[im 35/103  soft-tissue]
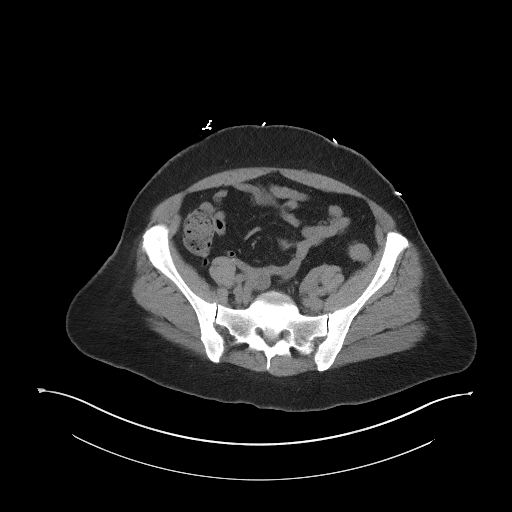
[im 41/103  soft-tissue]
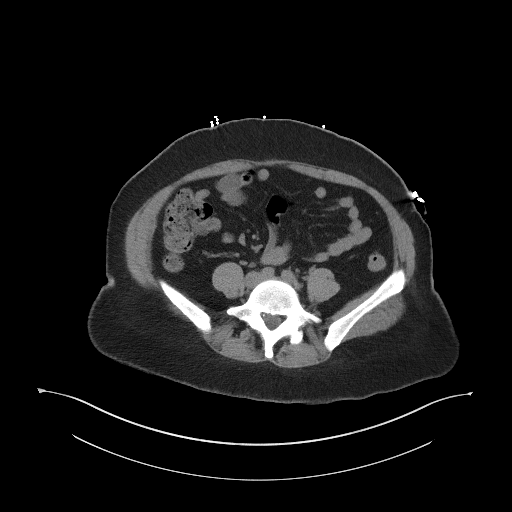
[im 55/103  soft-tissue]
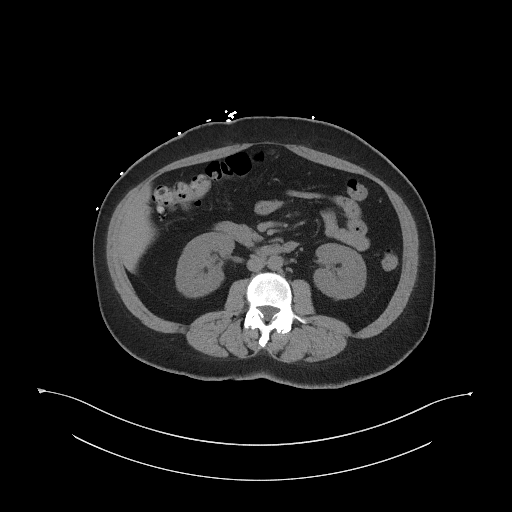
[im 62/103  soft-tissue]
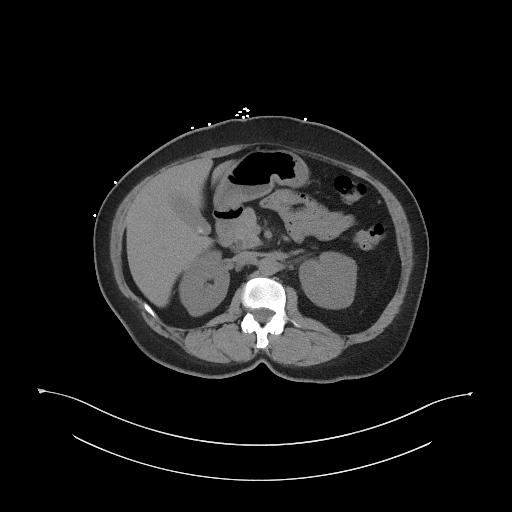
[im 69/103  soft-tissue]
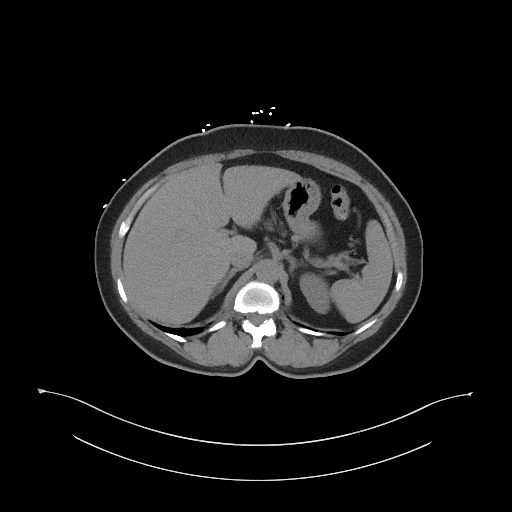
[im 69/103  bone]
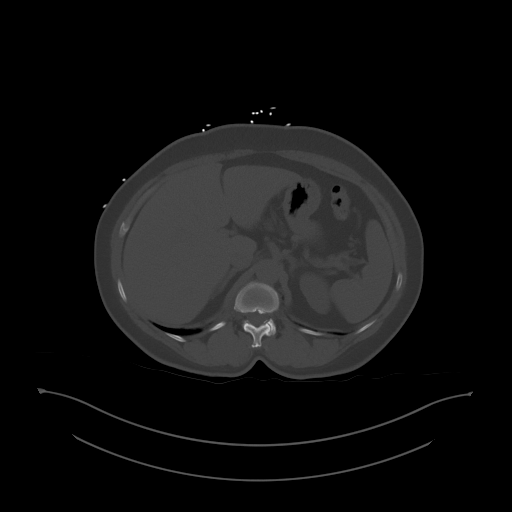
[im 75/103  soft-tissue]
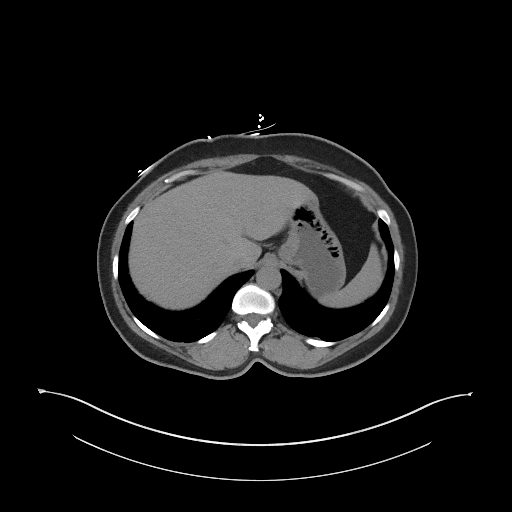
[im 82/103  soft-tissue]
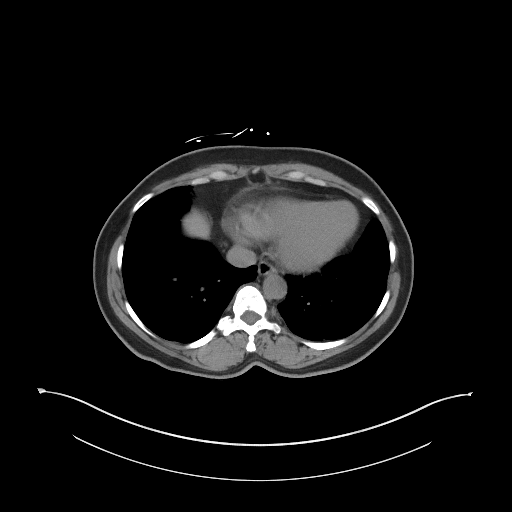
[im 89/103  soft-tissue]
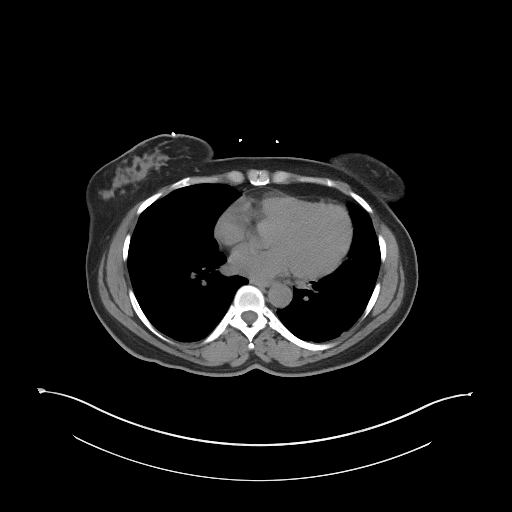
[im 96/103  soft-tissue]
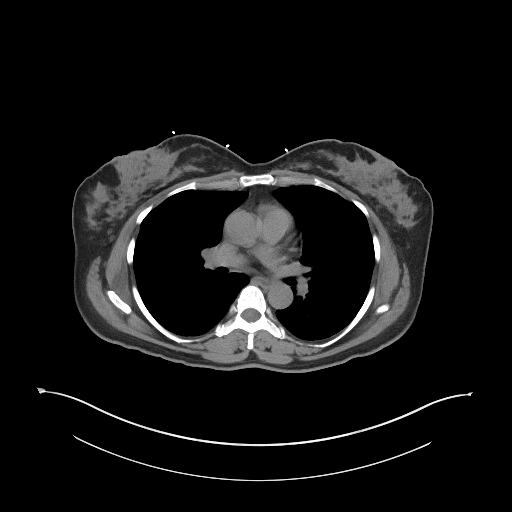

[Series 5: coronal st · coronal · 0.92mm/px · 3 of 106 slices shown]
[im 36/106  soft-tissue]
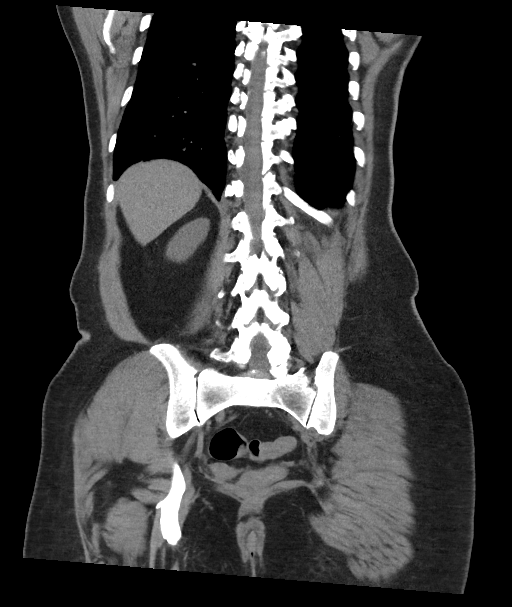
[im 47/106  soft-tissue]
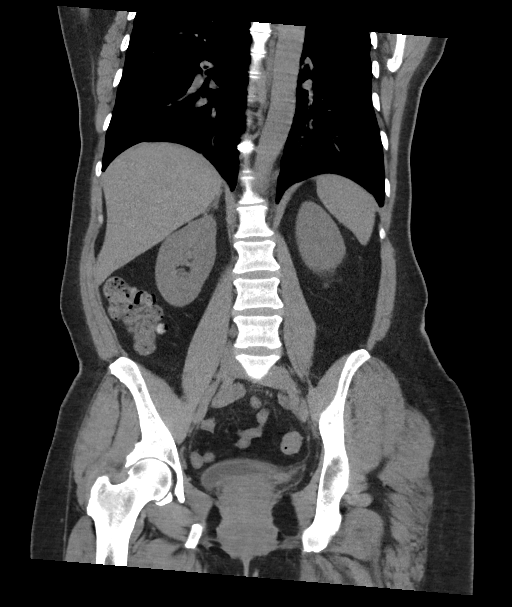
[im 59/106  soft-tissue]
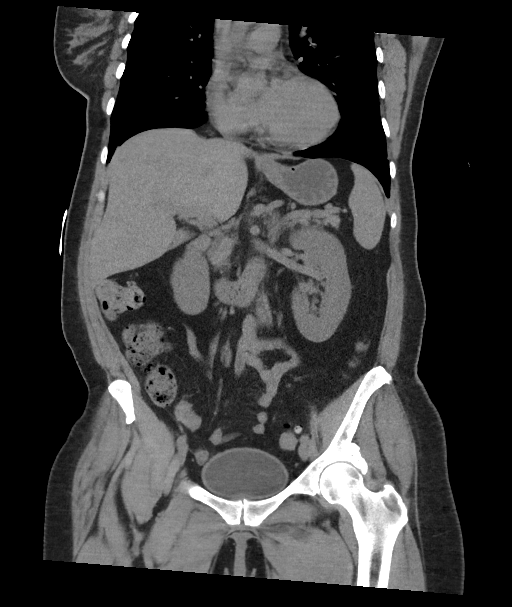

[16 of 46 positions shown; findings below may reference images not displayed]

FINDINGS: Lower chest: No acute abnormality.

Hepatobiliary: Normal liver. Small dependent gallstone. No
gallbladder wall thickening or evidence of inflammation. No bile
duct dilation.

Pancreas: Unremarkable. No pancreatic ductal dilatation or
surrounding inflammatory changes.

Spleen: Normal in size without focal abnormality.

Adrenals/Urinary Tract: Adrenal glands are unremarkable. Kidneys are
normal, without renal calculi, focal lesion, or hydronephrosis.
Bladder is unremarkable.

Stomach/Bowel: Normal stomach. Small bowel and colon are normal in
caliber. No wall thickening. No inflammation. Multiple colonic
diverticula. No diverticulitis. Normal appendix visualized.

Vascular/Lymphatic: No significant vascular findings are present. No
enlarged abdominal or pelvic lymph nodes.

Reproductive: Status post hysterectomy. No adnexal masses.

Other: No abdominal wall hernia or abnormality. No abdominopelvic
ascites.

Musculoskeletal: No fracture or acute finding. No bone lesion.
Bilateral hip joint degenerative/arthropathic changes.
IMPRESSION: 1. No acute findings within the abdomen or pelvis. No renal or
ureteral stones. No findings to account for the patient's abdominal
pain.
2. Colonic diverticula without evidence of diverticulitis. Single
gallstone.

## 2024-01-06 ENCOUNTER — Telehealth: Payer: Self-pay

## 2024-01-06 NOTE — Telephone Encounter (Signed)
 Attempt to Re-Engage in Care  No office visit or HIV labs completed at [facility] within the last 12 months. Patient is considered out of care.   Last [facility] Visit: 11/29/21  Last HIV Viral Load:  HIV 1 RNA Quant  Date Value Ref Range Status  11/29/2021 Not Detected Copies/mL Final    Last CD4 Count:  CD4 T Cell Abs  Date Value Ref Range Status  10/20/2019 792 400 - 1,790 /uL Final    Medication Dispense History: n/a  Interventions: Called [facility], call would not go through.   No recent encounters in Care Everywhere. No labs in [facility]. Most recent labs in [facility] were 07/2022 at [facility].   Called patient's daughter, Staci, she confirms that phone number in chart is correct.   Duration of Services: 10 minutes  Derriona Branscom, BSN, Charity fundraiser

## 2024-03-15 ENCOUNTER — Telehealth: Payer: Self-pay

## 2024-03-15 NOTE — Telephone Encounter (Signed)
 Called [facility] to offer appointment, no answer. Left HIPAA compliant voicemail requesting callback.   Melaya Hoselton, BSN, RN
